# Patient Record
Sex: Male | Born: 1995
Health system: Southern US, Community
[De-identification: ages and names within clinical notes are randomized; demographics above are authoritative.]

## PROBLEM LIST (undated history)

## (undated) DIAGNOSIS — Z21 Asymptomatic human immunodeficiency virus [HIV] infection status: Secondary | ICD-10-CM

## (undated) DIAGNOSIS — Z87442 Personal history of urinary calculi: Secondary | ICD-10-CM

## (undated) DIAGNOSIS — N200 Calculus of kidney: Secondary | ICD-10-CM

## (undated) DIAGNOSIS — T7840XA Allergy, unspecified, initial encounter: Secondary | ICD-10-CM

## (undated) DIAGNOSIS — K3580 Unspecified acute appendicitis: Principal | ICD-10-CM

## (undated) DIAGNOSIS — A539 Syphilis, unspecified: Secondary | ICD-10-CM

## (undated) DIAGNOSIS — L0292 Furuncle, unspecified: Secondary | ICD-10-CM

## (undated) DIAGNOSIS — B20 Human immunodeficiency virus [HIV] disease: Secondary | ICD-10-CM

## (undated) HISTORY — DX: Allergy, unspecified, initial encounter: T78.40XA

## (undated) HISTORY — DX: Calculus of kidney: N20.0

---
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
## 2013-01-10 ENCOUNTER — Encounter (INDEPENDENT_AMBULATORY_CARE_PROVIDER_SITE_OTHER): Payer: Self-pay

## 2013-09-01 ENCOUNTER — Emergency Department (HOSPITAL_COMMUNITY)
Admission: EM | Admit: 2013-09-01 | Discharge: 2013-09-01 | Disposition: A | Discharge status: Home / Self Care | Payer: Medicaid Other | Attending: Emergency Medicine | Admitting: Emergency Medicine

## 2013-09-01 ENCOUNTER — Encounter (HOSPITAL_COMMUNITY): Payer: Self-pay | Admitting: Emergency Medicine

## 2013-09-01 DIAGNOSIS — Z87442 Personal history of urinary calculi: Secondary | ICD-10-CM | POA: Insufficient documentation

## 2013-09-01 DIAGNOSIS — L02419 Cutaneous abscess of limb, unspecified: Secondary | ICD-10-CM | POA: Insufficient documentation

## 2013-09-01 DIAGNOSIS — L0291 Cutaneous abscess, unspecified: Secondary | ICD-10-CM

## 2013-09-01 DIAGNOSIS — Z8719 Personal history of other diseases of the digestive system: Secondary | ICD-10-CM | POA: Insufficient documentation

## 2013-09-01 DIAGNOSIS — F17200 Nicotine dependence, unspecified, uncomplicated: Secondary | ICD-10-CM | POA: Insufficient documentation

## 2013-09-01 DIAGNOSIS — L03119 Cellulitis of unspecified part of limb: Principal | ICD-10-CM

## 2013-09-01 MED ORDER — HYDROCODONE-ACETAMINOPHEN 5-325 MG PO TABS
1.0000 | ORAL_TABLET | Freq: Once | ORAL | Status: AC
  Administered 2013-09-01: 1 via ORAL
  Filled 2013-09-01: qty 1

## 2013-09-01 MED ORDER — SULFAMETHOXAZOLE-TRIMETHOPRIM 800-160 MG PO TABS: 2.0000 | ORAL_TABLET | Freq: Two times a day (BID) | ORAL | Status: DC

## 2013-09-01 MED ORDER — HYDROCODONE-ACETAMINOPHEN 5-325 MG PO TABS: ORAL_TABLET | ORAL | Status: DC

## 2013-09-01 NOTE — Discharge Instructions (Signed)
Please read and follow all provided instructions.  Your diagnoses today include:  1. Abscess     Tests performed today include:  Vital signs. See below for your results today.   Medications prescribed:   Bactrim (trimethoprim/sulfamethoxazole) - antibiotic  You have been prescribed an antibiotic medicine: take the entire course of medicine even if you are feeling better. Stopping early can cause the antibiotic not to work.   Vicodin (hydrocodone/acetaminophen) - narcotic pain medication  DO NOT drive or perform any activities that require you to be awake and alert because this medicine can make you drowsy. BE VERY CAREFUL not to take multiple medicines containing Tylenol (also called acetaminophen). Doing so can lead to an overdose which can damage your liver and cause liver failure and possibly death.  Take any prescribed medications only as directed.   Home care instructions:   Follow any educational materials contained in this packet  Follow-up instructions: *Return to the Emergency Department in 48 hours for a recheck if your symptoms are not significantly improved.  Please follow-up with your primary care provider in the next 1 week for further evaluation of your symptoms. If you do not have a primary care doctor -- see below for referral information.   Return instructions:  Return to the Emergency Department if you have:  Fever  Worsening symptoms  Worsening pain  Worsening swelling  Redness of the skin that moves away from the affected area, especially if it streaks away from the affected area   Any other emergent concerns  Your vital signs today were: BP 124/81   Pulse 97   Temp(Src) 99.2 F (37.3 C) (Oral)   Resp 16   SpO2 100% If your blood pressure (BP) was elevated above 135/85 this visit, please have this repeated by your doctor within one month. --------------

## 2013-09-01 NOTE — ED Notes (Signed)
Pt has abscess to R upper thigh. Pt states he "popped" the area yesterday and some pus came out. Pt has also been using warm compresses. Pt states area is still getting bigger and more painful. Pt ambulatory to exam room with steady gait.

## 2013-09-01 NOTE — ED Provider Notes (Signed)
CSN: [id number]     Arrival date & time 09/01/13  1802 History  This chart was scribed for non-physician practitioner, Rhea BleacherJosh Lio Wehrly, PA-C,working with Juliet RudeNathan R. Rubin PayorPickering, MD, by Karle PlumberJennifer Tensley, ED Scribe.  This patient was seen in room WTR8/WTR8 and the patient's care was started at 6:27 PM.     Chief Complaint  Patient presents with  . Abscess   The history is provided by the patient. No language interpreter was used.   HPI Comments:  Dustin [name] is a 18 y.o. transgender male who presents to the Emergency Department complaining of a new onset abscess to the right upper lateral thigh that started two days ago. Pt states she tried to "pop" the abscess and expressed some pus drainage yesterday. Pt reports applying warm compresses to the area with no significant relief. She states the area has progressively gotten worse and is very painful. She denies fever or chills.   Past Medical History  Diagnosis Date  . Kidney stones   . Allergy   . Acute appendicitis without mention of peritonitis 12/23/2012  . History of nephrolithiasis 12/23/2012    He had some hematuria on admit, asymptomatic   Past Surgical History  Procedure Laterality Date  . Laparoscopic appendectomy N/A 12/22/2012    Procedure: APPENDECTOMY LAPAROSCOPIC;  Surgeon: Adolph Pollackodd J Rosenbower, MD;  Location: [facility];  Service: General;  Laterality: N/A;   Family History  Problem Relation Age of Onset  . Heart disease Mother   . Drug abuse Father   . Diabetes Father   . Hypertension Father   . Hyperlipidemia Father   . Kidney disease Paternal Grandmother   . Stroke Paternal Grandmother   . Diabetes Paternal Grandfather   . Hyperlipidemia Paternal Grandfather   . Hypertension Paternal Grandfather    History  Substance Use Topics  . Smoking status: Current Every Day Smoker -- 1.00 packs/day    Types: Cigarettes  . Smokeless tobacco: Never Used  . Alcohol Use: 1.2 oz/week    2 Cans of beer per week    Review of  Systems  Constitutional: Negative for fever and chills.  Gastrointestinal: Negative for nausea and vomiting.  Skin: Negative for color change.       Abscess to right upper thigh  Hematological: Negative for adenopathy.    Allergies  Review of patient's allergies indicates no known allergies.  Home Medications   Prior to Admission medications   Medication Sig Start Date End Date Taking? Authorizing Provider  acetaminophen (TYLENOL) 325 MG tablet Take 2 tablets (650 mg total) by mouth every 6 (six) hours as needed. 12/23/12   Sherrie GeorgeWillard Jennings, PA-C  HYDROcodone-acetaminophen (NORCO) 5-325 MG per tablet Take 1 tablet by mouth every 6 (six) hours as needed for pain. 12/26/12   Adolph Pollackodd J Rosenbower, MD  ondansetron (ZOFRAN ODT) 4 MG disintegrating tablet Take 1 tablet (4 mg total) by mouth every 8 (eight) hours as needed for nausea. 12/24/12   Santiago GladHeather Laisure, PA-C  oxyCODONE-acetaminophen (PERCOCET/ROXICET) 5-325 MG per tablet This has tylenol in it.  Do not take more than 4000 mg of tylenol per day. 12/23/12   Sherrie GeorgeWillard Jennings, PA-C  oxyCODONE-acetaminophen (PERCOCET/ROXICET) 5-325 MG per tablet Take 1-2 tablets by mouth every 6 (six) hours as needed for pain. 12/24/12   Santiago GladHeather Laisure, PA-C   Triage Vitals: BP 124/81  Pulse 97  Temp(Src) 99.2 F (37.3 C) (Oral)  Resp 16  SpO2 100% Physical Exam  Nursing note and vitals reviewed. Constitutional: He appears well-developed and  well-nourished.  HENT:  Head: Normocephalic and atraumatic.  Eyes: Conjunctivae and EOM are normal. Right eye exhibits no discharge. Left eye exhibits no discharge.  Neck: Normal range of motion. Neck supple.  Cardiovascular: Normal rate, regular rhythm and normal heart sounds.   Pulmonary/Chest: Effort normal and breath sounds normal.  Abdominal: Soft. There is no tenderness.  Musculoskeletal: Normal range of motion.  Neurological: He is alert.  Skin: Skin is warm and dry. There is erythema.  6 cm area of induration of  right lateral thigh without cellulitis.  Psychiatric: He has a normal mood and affect. His behavior is normal.    ED Course  Procedures (including critical care time) DIAGNOSTIC STUDIES: Oxygen Saturation is 100% on RA, normal by my interpretation.   COORDINATION OF CARE: 6:31 PM- Will I & D abscess and prescribe antibiotics. Pt verbalizes understanding and agrees to plan.  INCISION AND DRAINAGE PROCEDURE NOTE: Patient identification was confirmed and verbal consent was obtained. This procedure was performed by Rhea BleacherJosh Nai Borromeo, PA-C at 6:38 PM. Site: right lateral thigh Sterile procedures observed Needle size: 25 G Anesthetic used (type and amt): Lidocaine 2% with Epinephrine (3 mL) Blade size: # 11 Drainage: large Complexity: Complex Site anesthetized, incision made over site, wound drained and explored loculations, covered with dry, sterile dressing.  Pt tolerated procedure well without complications.  Instructions for care discussed verbally and pt provided with additional written instructions for homecare and f/u.   Medications  HYDROcodone-acetaminophen (NORCO/VICODIN) 5-325 MG per tablet 1 tablet (1 tablet Oral Given 09/01/13 1854)    Labs Review Labs Reviewed - No data to display  Imaging Review No results found.   EKG Interpretation None      Patient seen and examined.  Pt agrees to proceed with I&D.   Vital signs reviewed and are as follows: Filed Vitals:   09/01/13 1820  BP: 124/81  Pulse: 97  Temp: 99.2 F (37.3 C)  Resp: 16   7:19 PM The patient was urged to return to the Emergency Department urgently with worsening pain, swelling, expanding erythema especially if it streaks away from the affected area, fever, or if they have any other concerns.   The patient was urged to return to the Emergency Department or go to their PCP in 48 hours for wound recheck if the area is not significantly improved.  Patient counseled on use of narcotic pain medications.  Counseled not to combine these medications with others containing tylenol. Urged not to drink alcohol, drive, or perform any other activities that requires focus while taking these medications. The patient verbalizes understanding and agrees with the plan.  The patient verbalized understanding and stated agreement with this plan.   MDM   Final diagnoses:  Abscess   Patient with skin abscess amenable to incision and drainage. Given size, I feel antibiotics are appropriate. No signs of cellulitis is surrounding skin. Will d/c to home. No systemic symptoms of illness.   I personally performed the services described in this documentation, which was scribed in my presence. The recorded information has been reviewed and is accurate.    Renne CriglerJoshua March Joos, PA-C 09/01/13 1920

## 2013-09-01 NOTE — ED Provider Notes (Signed)
Medical screening examination/treatment/procedure(s) were performed by non-physician practitioner and as supervising physician I was immediately available for consultation/collaboration.   EKG Interpretation None       Jametta Moorehead R. Tyrique Sporn, MD 09/01/13 2327 

## 2014-09-20 IMAGING — CT CT ABD-PELV W/ CM
1 of 2 series · 15 of 32 positions shown, 19 images · IV contrast (OMNIPAQUE 300)
Comparison: 10/04/2011.

CLINICAL DATA: Right lower quadrant tenderness.

CT ABDOMEN AND PELVIS WITH CONTRAST
TECHNIQUE: Multidetector CT imaging of the abdomen and pelvis was
performed following the standard protocol during bolus
administration of intravenous contrast.
Contrast: 100mL OMNIPAQUE IOHEXOL 300 MG/ML  SOLN

[Series 2: abd/pel with · axial · 0.74mm/px · z∈[-506,-71]mm · 15 of 95 slices shown, 19 images]
[im 4/95  soft-tissue]
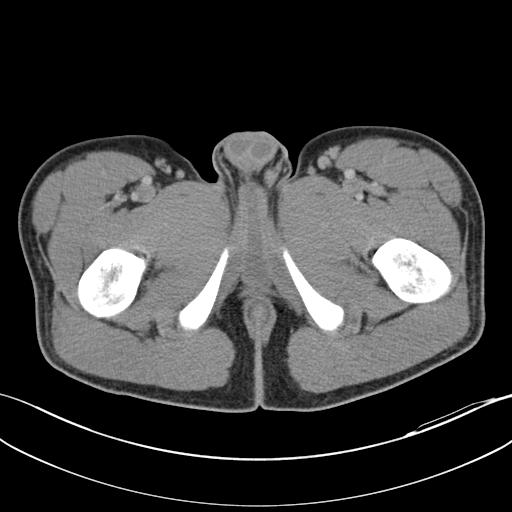
[im 4/95  bone]
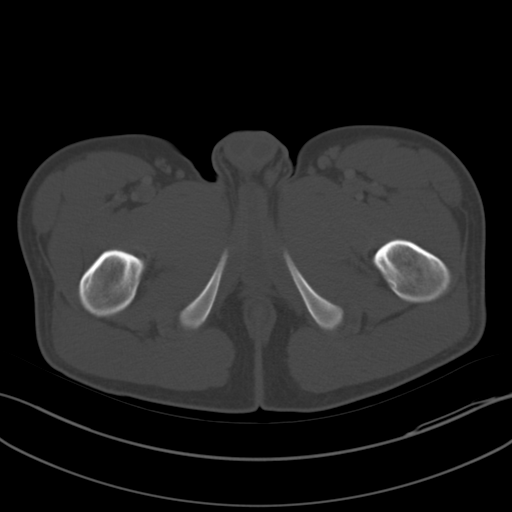
[im 12/95  soft-tissue]
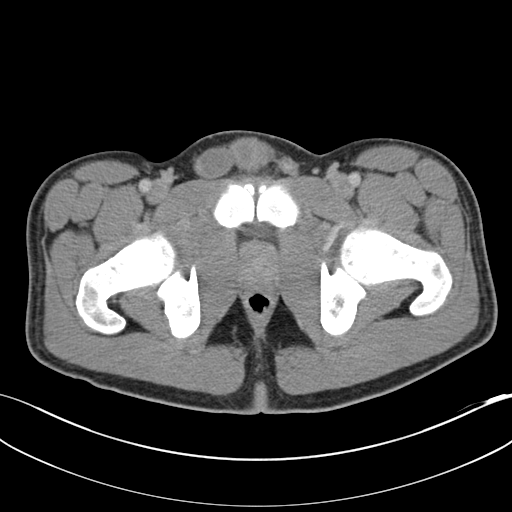
[im 19/95  soft-tissue]
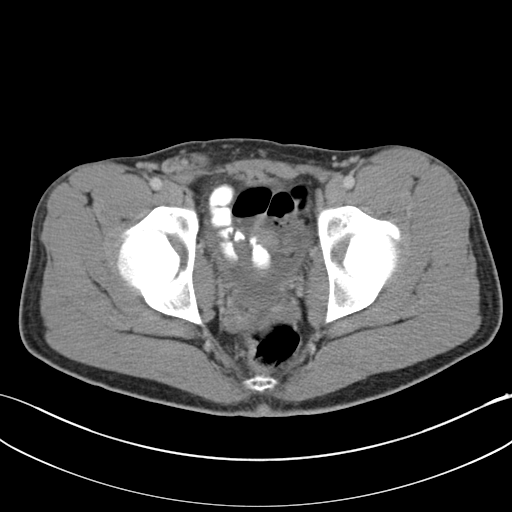
[im 27/95  soft-tissue]
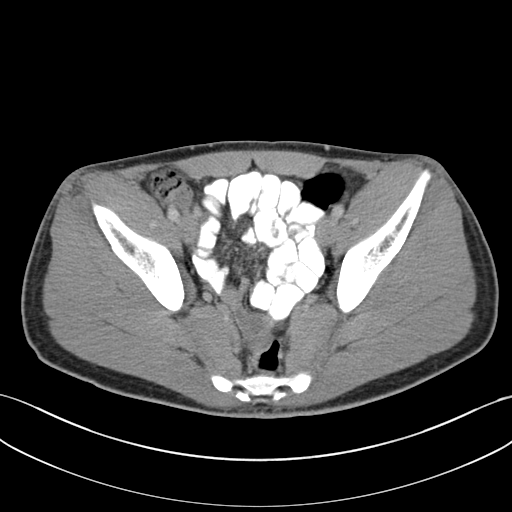
[im 34/95  soft-tissue]
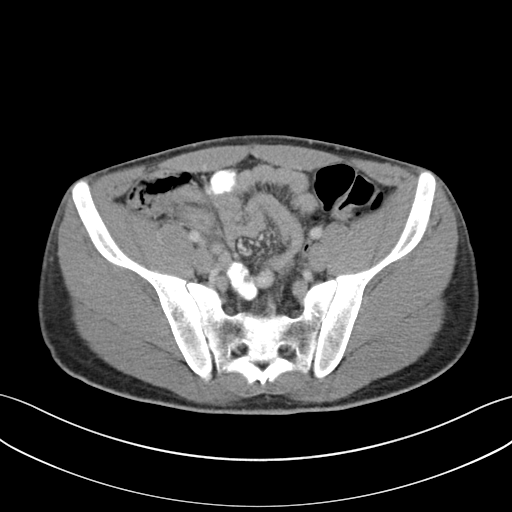
[im 42/95  soft-tissue]
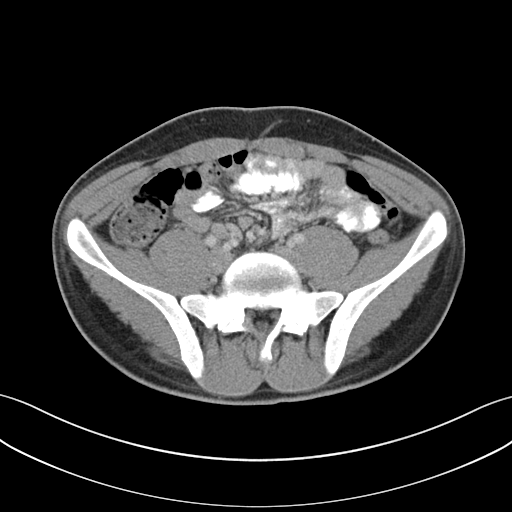
[im 49/95  soft-tissue]
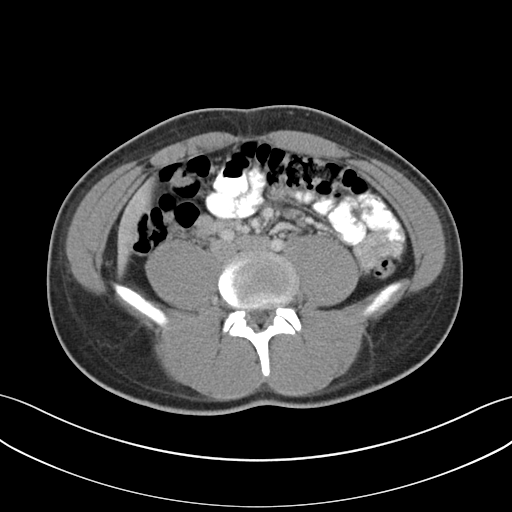
[im 53/95  soft-tissue]
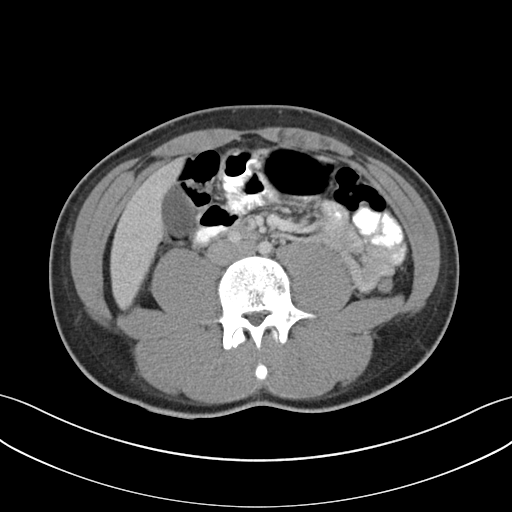
[im 61/95  soft-tissue]
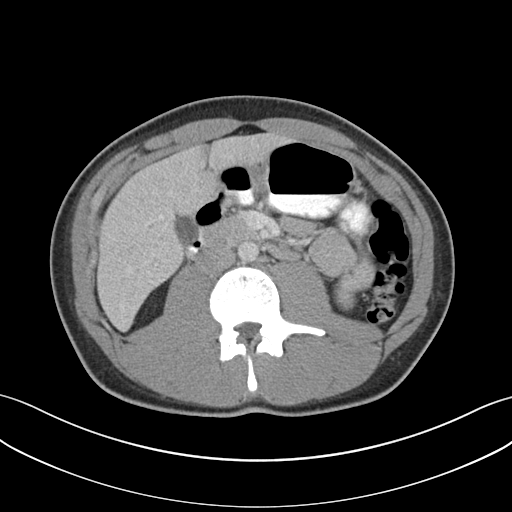
[im 61/95  bone]
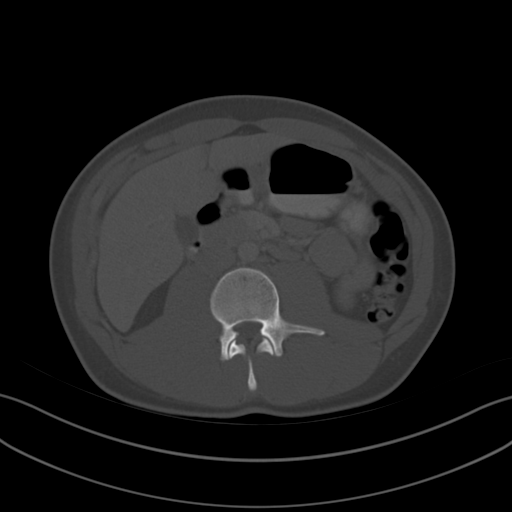
[im 68/95  soft-tissue]
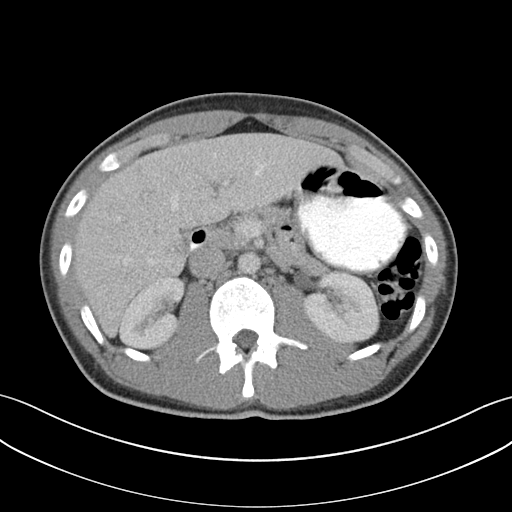
[im 76/95  soft-tissue]
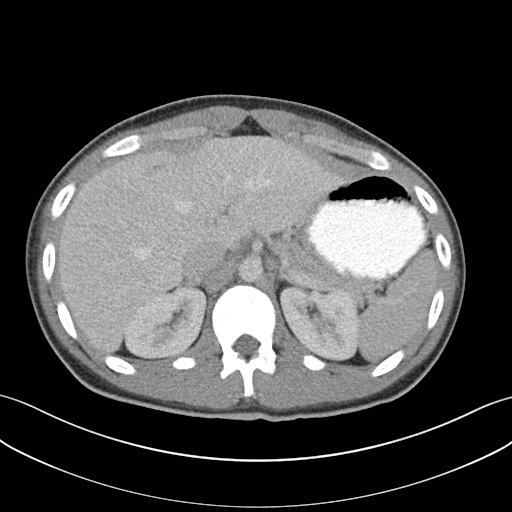
[im 79/95  lung]
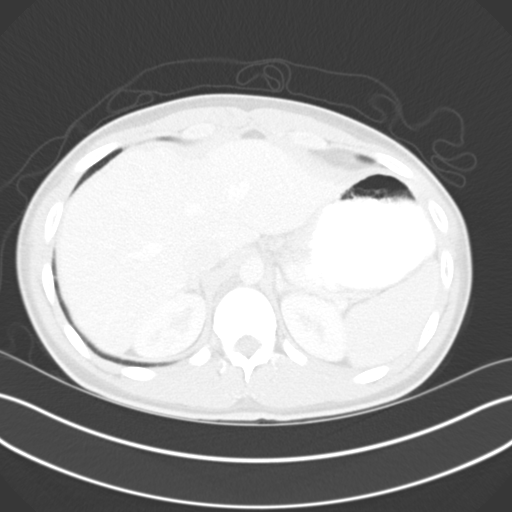
[im 83/95  soft-tissue]
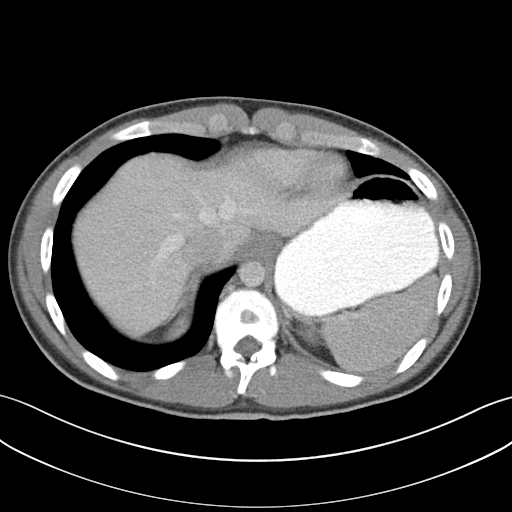
[im 83/95  lung]
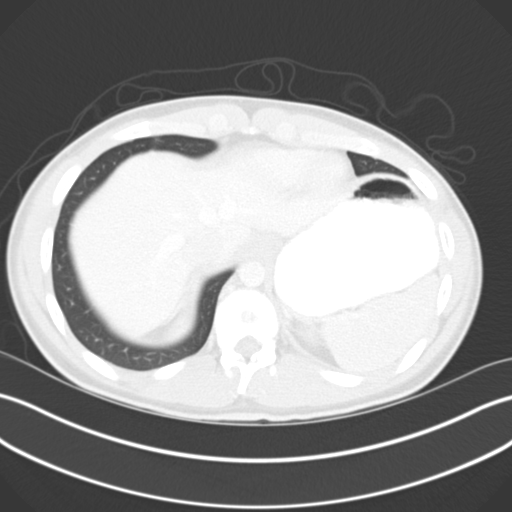
[im 87/95  lung]
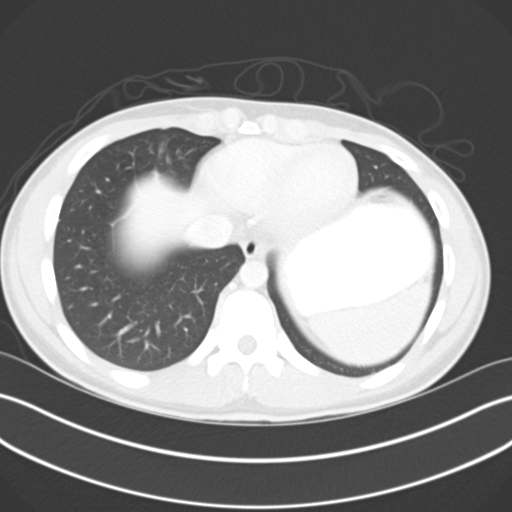
[im 91/95  soft-tissue]
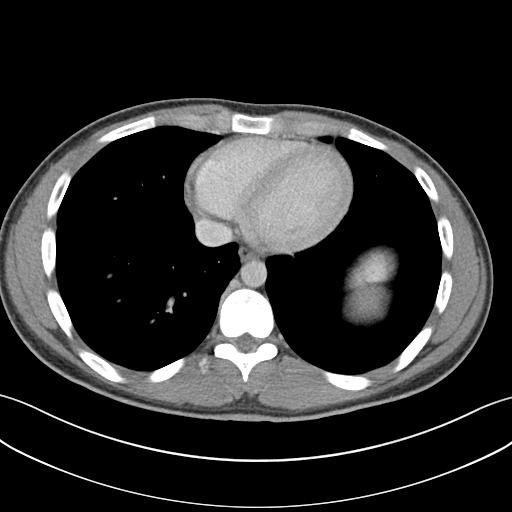
[im 91/95  lung]
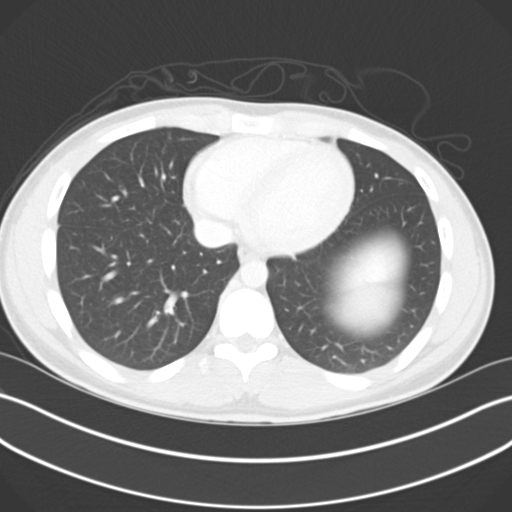

[15 of 32 positions shown; findings below may reference images not displayed]

FINDINGS: BODY WALL: Unremarkable.

LOWER CHEST:

Mediastinum: Unremarkable.

Lungs/pleura: No consolidation.

ABDOMEN/PELVIS:

Liver: No focal abnormality.

Biliary: No evidence of biliary obstruction or stone.

Pancreas: Unremarkable.

Spleen: Unremarkable.

Adrenals: Unremarkable.

Kidneys and ureters: No hydronephrosis or stone.

Bladder: Unremarkable.

Bowel:  The appendix dilated to 16 mm with thick enhancing wall.
Along the posterior margin, there is an outpouching of the wall,
likely developing perforation. Reactive ileocolic lymph node
enlargement.

Retroperitoneum: No mass or adenopathy.

Peritoneum: No free fluid or gas.

Reproductive: Unremarkable.

Vascular: No acute abnormality.

OSSEOUS: No acute abnormalities. No suspicious lytic or blastic
lesions.

Critical Value/emergent results were called by telephone at the
time of interpretation on 12/22/2012 at [DATE] a.m. to ED staff who
will relay message to Dr Fray when he becomes available.
IMPRESSION: Acute appendicitis with developing perforation.

## 2014-09-22 IMAGING — US US RENAL
1 series · 13 of 25 positions shown · non-contrast
Comparison: CT abdomen/pelvis 12/22/2012

CLINICAL DATA: Right flank pain

RENAL/URINARY TRACT ULTRASOUND COMPLETE

[Series 1: us renal · 0.25mm/px · 13 of 48 slices shown]
[im 1/48]
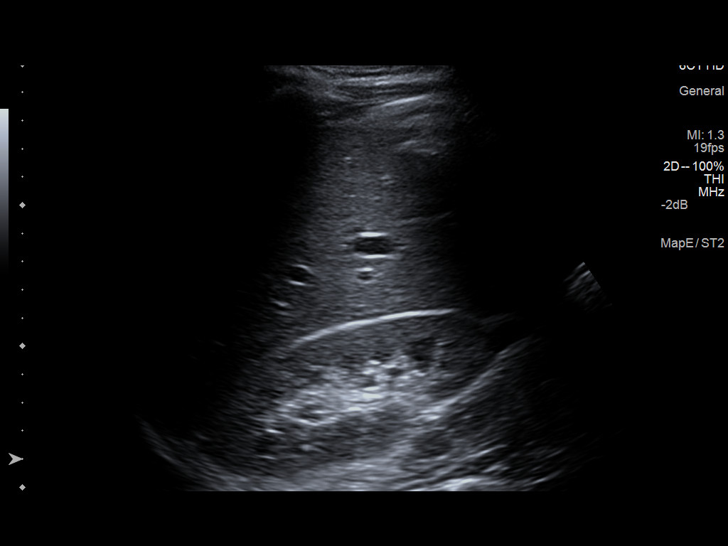
[im 4/48]
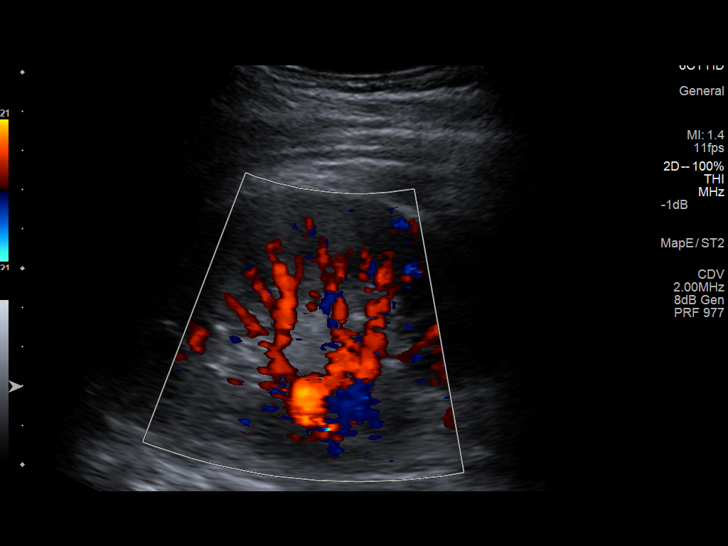
[im 8/48]
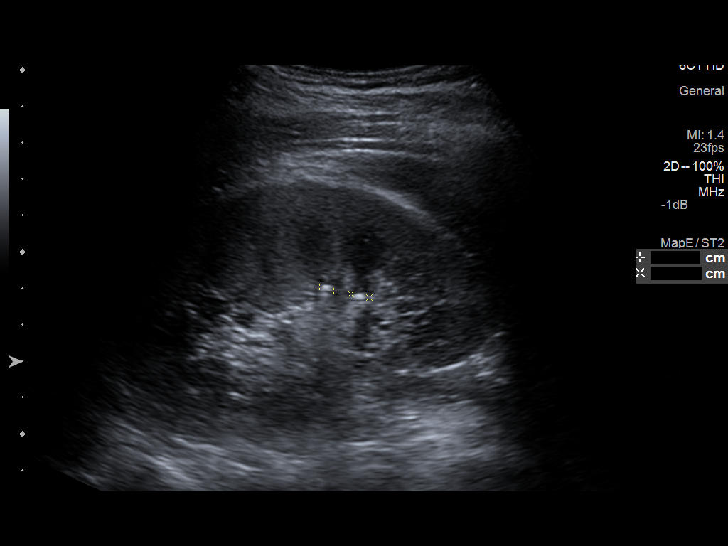
[im 12/48]
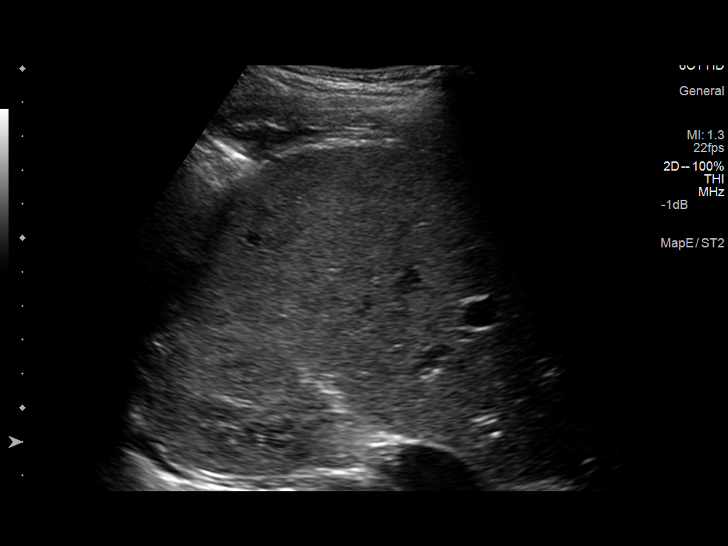
[im 16/48]
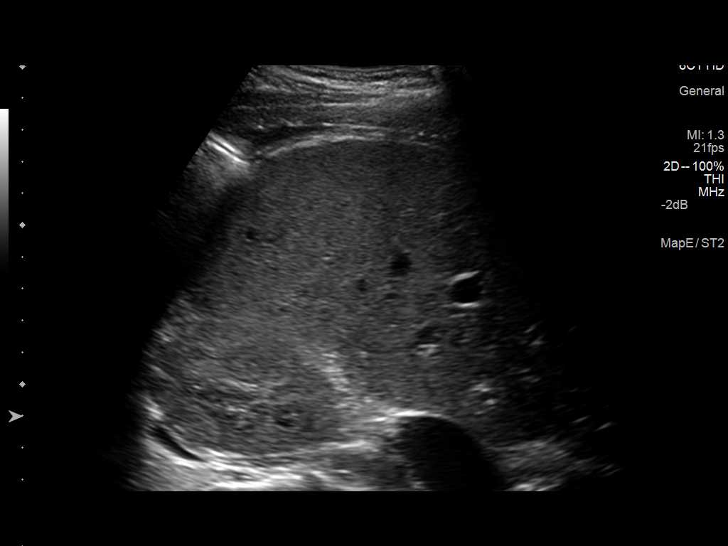
[im 20/48]
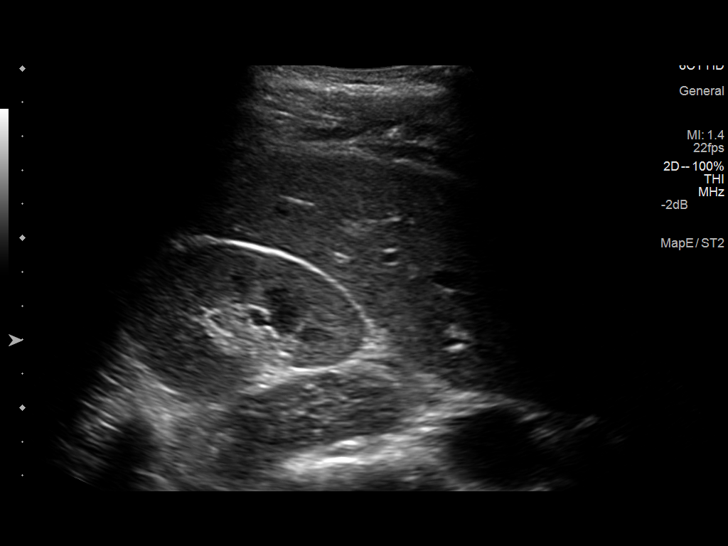
[im 24/48]
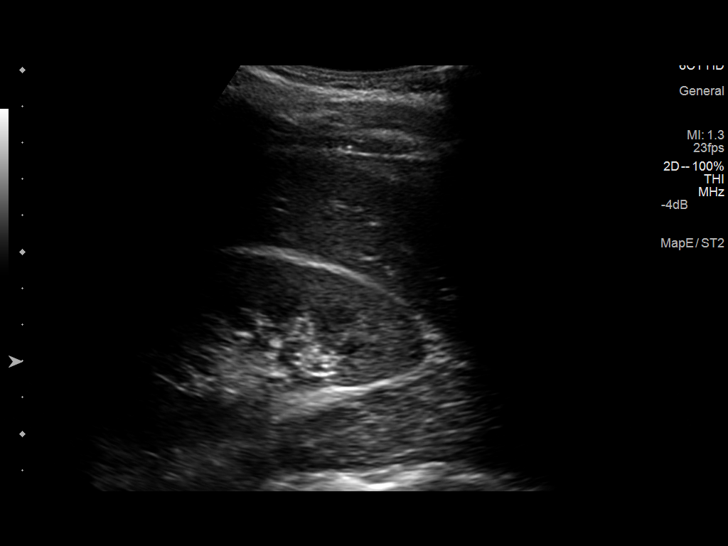
[im 28/48]
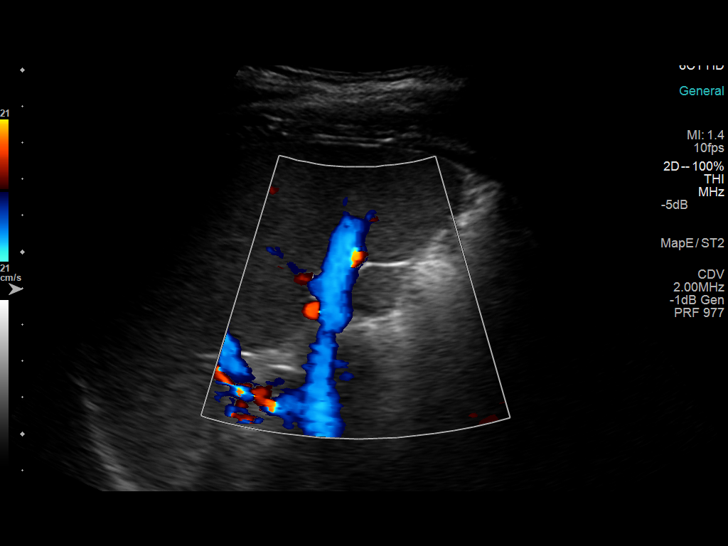
[im 32/48]
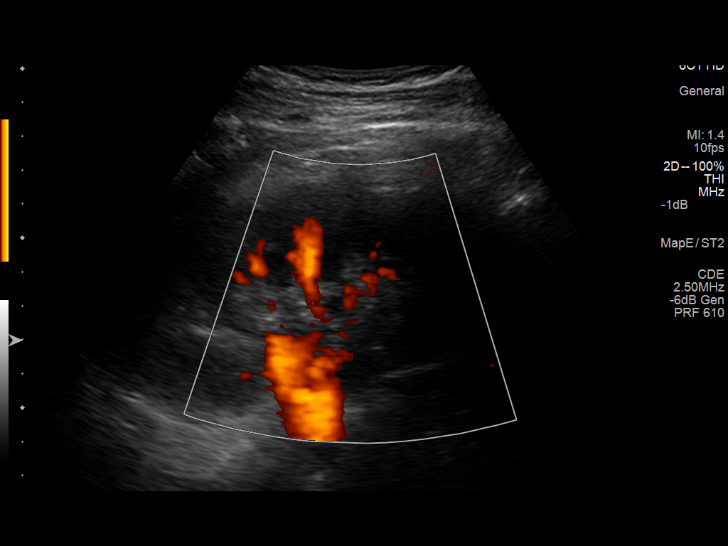
[im 36/48]
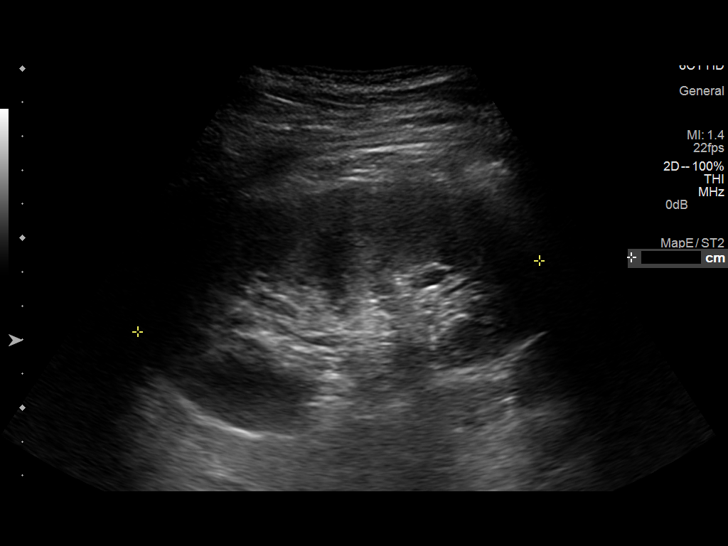
[im 40/48]
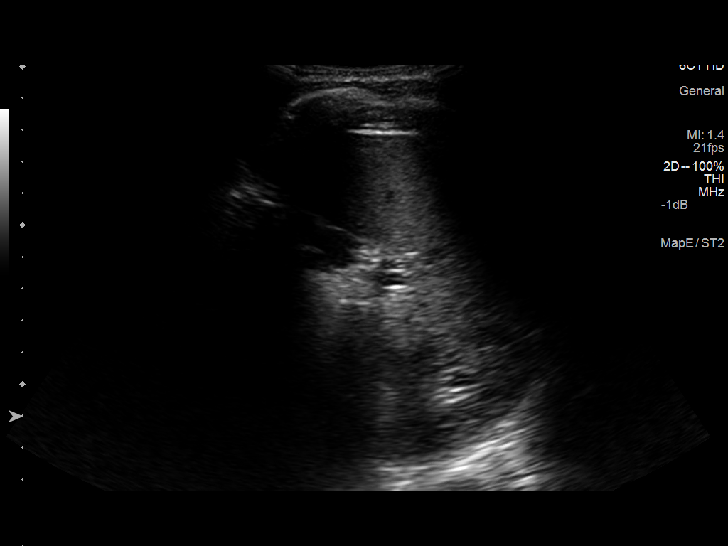
[im 44/48]
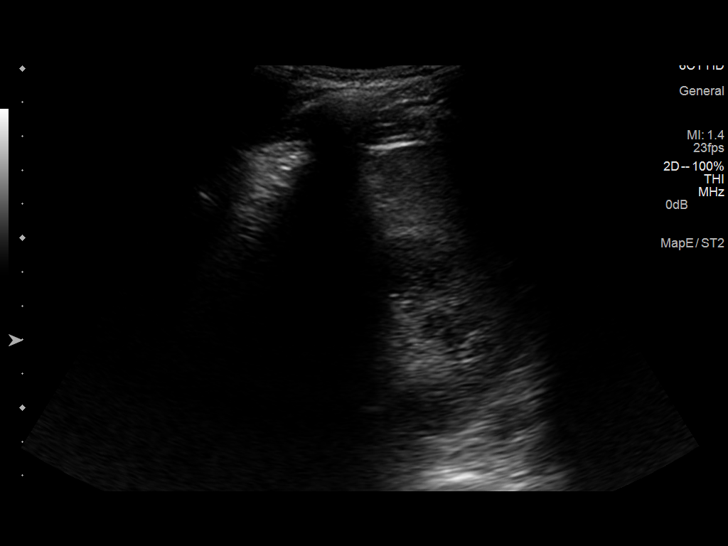
[im 48/48]
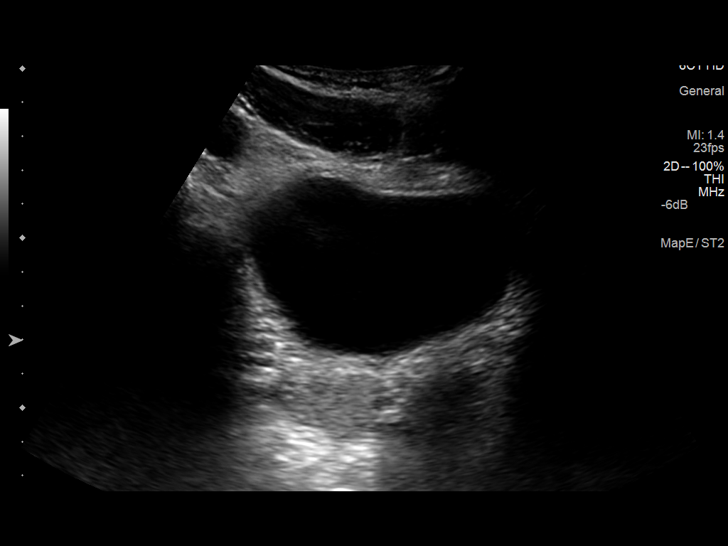

[13 of 25 positions shown; findings below may reference images not displayed]

FINDINGS: Right Kidney:  Normal in size (11.4 cm) and parenchymal
echogenicity.  No evidence of mass or hydronephrosis. Two 3 mm
echogenic foci identified at the junction of the interpolar and
lower pole regions to not definitively demonstrate shadowing and
cannot be identified on the recent CT scan.  These may represent
small renal calculi.

Left Kidney:  Normal in size (12 cm) and parenchymal echogenicity.
No evidence of mass or hydronephrosis.

Bladder:  Appears normal for degree of bladder distention.

Other:  Small accessory spleen incidentally noted.  There is a
small right pleural effusion.  Query small amount of perinephric
fluid near the lower pole of the right kidney.  This may be
artifactual.
IMPRESSION: 1.  Negative for hydronephrosis.
2.  Two small 3 mm echogenic foci in the right kidney at the
junction of the interpolar and lower pole regions may represent a
small nonobstructing renal calculi.  These were not visible on the
recent CT scan from 12/22/2012.
3.  Trace right pleural effusion and right perinephric fluid likely
related to recent appendectomy.

## 2015-11-13 ENCOUNTER — Emergency Department (HOSPITAL_COMMUNITY): Payer: Self-pay

## 2015-11-13 ENCOUNTER — Encounter (HOSPITAL_COMMUNITY): Payer: Self-pay

## 2015-11-13 ENCOUNTER — Emergency Department (HOSPITAL_COMMUNITY)
Admission: EM | Admit: 2015-11-13 | Discharge: 2015-11-13 | Disposition: A | Discharge status: Home / Self Care | Payer: Self-pay | Attending: Emergency Medicine | Admitting: Emergency Medicine

## 2015-11-13 DIAGNOSIS — B349 Viral infection, unspecified: Secondary | ICD-10-CM | POA: Insufficient documentation

## 2015-11-13 DIAGNOSIS — F17210 Nicotine dependence, cigarettes, uncomplicated: Secondary | ICD-10-CM | POA: Insufficient documentation

## 2015-11-13 DIAGNOSIS — J029 Acute pharyngitis, unspecified: Secondary | ICD-10-CM

## 2015-11-13 LAB — COMPREHENSIVE METABOLIC PANEL
ALT: 14 U/L — ABNORMAL LOW (ref 17–63)
ANION GAP: 8 (ref 5–15)
AST: 15 U/L (ref 15–41)
Albumin: 4.2 g/dL (ref 3.5–5.0)
Alkaline Phosphatase: 95 U/L (ref 38–126)
BUN: 13 mg/dL (ref 6–20)
CALCIUM: 8.5 mg/dL — AB (ref 8.9–10.3)
CHLORIDE: 104 mmol/L (ref 101–111)
CO2: 24 mmol/L (ref 22–32)
Creatinine, Ser: 1.45 mg/dL — ABNORMAL HIGH (ref 0.61–1.24)
GFR calc non Af Amer: >60 mL/min (ref >60)
Glucose, Bld: 91 mg/dL (ref 65–99)
POTASSIUM: 3.8 mmol/L (ref 3.5–5.1)
SODIUM: 136 mmol/L (ref 135–145)
Total Bilirubin: 0.3 mg/dL (ref 0.3–1.2)
Total Protein: 7.6 g/dL (ref 6.5–8.1)

## 2015-11-13 LAB — URINALYSIS, ROUTINE W REFLEX MICROSCOPIC
Bilirubin Urine: NEGATIVE
GLUCOSE, UA: NEGATIVE mg/dL
Hgb urine dipstick: NEGATIVE
Ketones, ur: NEGATIVE mg/dL
LEUKOCYTES UA: NEGATIVE
Nitrite: NEGATIVE
PROTEIN: NEGATIVE mg/dL
Specific Gravity, Urine: 1.014 (ref 1.005–1.030)
pH: 6.5 (ref 5.0–8.0)

## 2015-11-13 LAB — CBC WITH DIFFERENTIAL/PLATELET
BASOS PCT: 1 %
Basophils Absolute: 0 10*3/uL (ref 0.0–0.1)
EOS ABS: 0 10*3/uL (ref 0.0–0.7)
EOS PCT: 1 %
HCT: 43.5 % (ref 39.0–52.0)
Hemoglobin: 14.3 g/dL (ref 13.0–17.0)
LYMPHS PCT: 36 %
Lymphs Abs: 1.4 10*3/uL (ref 0.7–4.0)
MCH: 23.6 pg — ABNORMAL LOW (ref 26.0–34.0)
MCHC: 32.9 g/dL (ref 30.0–36.0)
MCV: 71.8 fL — AB (ref 78.0–100.0)
MONOS PCT: 12 %
Monocytes Absolute: 0.5 10*3/uL (ref 0.1–1.0)
NEUTROS PCT: 50 %
Neutro Abs: 2.1 10*3/uL (ref 1.7–7.7)
PLATELETS: 145 10*3/uL — AB (ref 150–400)
RBC: 6.06 MIL/uL — ABNORMAL HIGH (ref 4.22–5.81)
RDW: 14.4 % (ref 11.5–15.5)
WBC: 4 10*3/uL (ref 4.0–10.5)

## 2015-11-13 MED ORDER — SODIUM CHLORIDE 0.9 % IV BOLUS (SEPSIS)
1000.0000 mL | Freq: Once | INTRAVENOUS | Status: AC
  Administered 2015-11-13: 1000 mL via INTRAVENOUS

## 2015-11-13 MED ORDER — CLINDAMYCIN HCL 300 MG PO CAPS
450.0000 mg | ORAL_CAPSULE | Freq: Once | ORAL | Status: AC
  Administered 2015-11-13: 13:00:00 450 mg via ORAL
  Filled 2015-11-13: qty 1

## 2015-11-13 MED ORDER — CLINDAMYCIN HCL 150 MG PO CAPS: 450.0000 mg | ORAL_CAPSULE | Freq: Three times a day (TID) | ORAL | 0 refills | Status: DC

## 2015-11-13 NOTE — ED Provider Notes (Signed)
[facility]-EMERGENCY DEPT Provider Note   CSN: [id number] Arrival date & time: 11/13/15  1779  First Provider Contact:  First MD Initiated Contact with Patient 11/13/15 [phone]        History   Chief Complaint Chief Complaint  Patient presents with  . Weakness    HPI Dustin [name] is a 20 y.o. male who presents to the Emergency Department complaining of weakness.  She presents for multiple complaints. She reports feeling poorly for the last week with headache, sore throat, greatest on the left side, cough, feeling generally weak and feeling as if she might pass out with activity. She also has some swelling in her groin. She denies any fevers but feels hot at times. No sick contacts. Symptoms are moderate, constant, worsening.  The history is provided by the patient. No language interpreter was used.  Weakness     Past Medical History:  Diagnosis Date  . Acute appendicitis without mention of peritonitis 12/23/2012  . Allergy   . History of nephrolithiasis 12/23/2012   He had some hematuria on admit, asymptomatic  . Kidney stones     Patient Active Problem List   Diagnosis Date Noted  . Acute appendicitis without mention of peritonitis 12/23/2012  . History of nephrolithiasis 12/23/2012  . Elevated BP 07/22/2011  . Exposure to communicable disease 07/21/2011  . ATOPIC DERMATITIS 08/15/2007  . RHINITIS, ALLERGIC 06/17/2006    Past Surgical History:  Procedure Laterality Date  . LAPAROSCOPIC APPENDECTOMY N/A 12/22/2012   Procedure: APPENDECTOMY LAPAROSCOPIC;  Surgeon: Adolph Pollack, MD;  Location: [facility];  Service: General;  Laterality: N/A;    OB History    No data available       Home Medications    Prior to Admission medications   Medication Sig Start Date End Date Taking? Authorizing Provider  clindamycin (CLEOCIN) 150 MG capsule Take 3 capsules (450 mg total) by mouth 3 (three) times daily. 11/13/15   Tilden Fossa, MD    Family History Family History    Problem Relation Age of Onset  . Heart disease Mother   . Drug abuse Father   . Diabetes Father   . Hypertension Father   . Hyperlipidemia Father   . Kidney disease Paternal Grandmother   . Stroke Paternal Grandmother   . Diabetes Paternal Grandfather   . Hyperlipidemia Paternal Grandfather   . Hypertension Paternal Grandfather     Social History Social History  Substance Use Topics  . Smoking status: Current Every Day Smoker    Packs/day: 1.00    Types: Cigarettes  . Smokeless tobacco: Never Used  . Alcohol use 1.2 oz/week    2 Cans of beer per week     Allergies   Review of patient's allergies indicates no known allergies.   Review of Systems Review of Systems  Genitourinary: Negative for discharge and dysuria.  Skin: Positive for rash.       Mild itching  Neurological: Positive for weakness.  All other systems reviewed and are negative.    Physical Exam Updated Vital Signs BP 142/91 (BP Location: Left Arm)   Pulse 92   Temp 100.1 F (37.8 C) (Oral)   Resp 18   SpO2 98%   Physical Exam  Constitutional: He is oriented to person, place, and time. He appears well-developed and well-nourished.  HENT:  Head: Normocephalic and atraumatic.  Mild erythema of the posterior oropharynx, left tonsillar pillar is slightly more edematous than the right  Cardiovascular: Regular rhythm.   No  murmur heard. Tachycardia  Pulmonary/Chest: Effort normal and breath sounds normal. No respiratory distress.  Abdominal: Soft. There is no tenderness. There is no rebound and no guarding.  Genitourinary: Penis normal.  Genitourinary Comments: Right inguinal lymphadenopathy  Musculoskeletal: He exhibits no edema or tenderness.  Neurological: He is alert and oriented to person, place, and time.  Skin: Skin is warm and dry. Rash noted.  Diffuse macular rash of the face, trunk, extremities.  Psychiatric: He has a normal mood and affect. His behavior is normal.  Nursing note and  vitals reviewed.    ED Treatments / Results  Labs (all labs ordered are listed, but only abnormal results are displayed) Labs Reviewed  COMPREHENSIVE METABOLIC PANEL - Abnormal; Notable for the following:       Result Value   Creatinine, Ser 1.45 (*)    Calcium 8.5 (*)    ALT 14 (*)    All other components within normal limits  CBC WITH DIFFERENTIAL/PLATELET - Abnormal; Notable for the following:    RBC 6.06 (*)    MCV 71.8 (*)    MCH 23.6 (*)    Platelets 145 (*)    All other components within normal limits  RPR  URINALYSIS, ROUTINE W REFLEX MICROSCOPIC (NOT AT [facility])  HIV ANTIBODY (ROUTINE TESTING)    EKG  EKG Interpretation None       Radiology Dg Chest 2 View  Result Date: 11/13/2015 CLINICAL DATA:  Worsening cough and sore throat for about 1 week. Fever this morning. EXAM: CHEST  2 VIEW COMPARISON:  04/22/2004 FINDINGS: The heart size and mediastinal contours are within normal limits. Both lungs are clear. The visualized skeletal structures are unremarkable. IMPRESSION: No active cardiopulmonary disease. Electronically Signed   By: Kennith Center M.D.   On: 11/13/2015 09:07   Procedures Procedures (including critical care time)  Medications Ordered in ED Medications  sodium chloride 0.9 % bolus 1,000 mL (0 mLs Intravenous Stopped 11/13/15 1100)  clindamycin (CLEOCIN) capsule 450 mg (450 mg Oral Given 11/13/15 1300)     Initial Impression / Assessment and Plan / ED Course  I have reviewed the triage vital signs and the nursing notes.  Pertinent labs & imaging results that were available during my care of the patient were reviewed by me and considered in my medical decision making (see chart for details).  Clinical Course    Pt here for body aches, malaise.  She is nontoxic appearing on exam.  Rash is c/w viral exanthem.  There is mild tonsillar asymmetry on exam with no definite PTA but will treat for potential developing infection.  D/w pt home care, close  outpatient follow up, return precautions.    Final Clinical Impressions(s) / ED Diagnoses   Final diagnoses:  Viral illness  Pharyngitis    New Prescriptions Discharge Medication List as of 11/13/2015 12:48 PM    START taking these medications   Details  clindamycin (CLEOCIN) 150 MG capsule Take 3 capsules (450 mg total) by mouth 3 (three) times daily., Starting Wed 11/13/2015, Print         Tilden Fossa, MD 11/14/15 [phone]

## 2015-11-13 NOTE — ED Triage Notes (Signed)
He c/o feeling as if he were going to faint (but didn't). He further c/o sore "bumps" at right groin area.  He is in no distress.  Pt. Also c/o mild, non-productive cough and sore throat.

## 2015-11-13 NOTE — ED Notes (Signed)
Pt was notified about Urinalysis 

## 2015-11-14 LAB — RPR: RPR: NONREACTIVE

## 2015-11-20 ENCOUNTER — Telehealth: Payer: Self-pay | Admitting: Infectious Disease

## 2015-11-20 LAB — HIV ANTIBODY (ROUTINE TESTING W REFLEX)

## 2015-11-20 LAB — RNA QUALITATIVE: HIV 1 RNA Qualitative: UNDETERMINED

## 2015-11-20 LAB — HIV 1/2 AB DIFFERENTIATION
HIV 1 Ab: NEGATIVE
HIV 2 Ab: NEGATIVE

## 2015-11-20 NOTE — Telephone Encounter (Signed)
Patient sounds like they VERY much could have acute HIV  Dustin [name] can we bring her in to research for repeat test and an HIV ULtraquant so we can get it turned around  They didn't have enough to do qual rna and pts ssx sound like acute HIV

## 2015-11-24 ENCOUNTER — Emergency Department (HOSPITAL_COMMUNITY)
Admission: EM | Admit: 2015-11-24 | Discharge: 2015-11-24 | Disposition: A | Discharge status: Home / Self Care | Payer: Medicaid Other | Attending: Emergency Medicine | Admitting: Emergency Medicine

## 2015-11-24 DIAGNOSIS — L989 Disorder of the skin and subcutaneous tissue, unspecified: Secondary | ICD-10-CM | POA: Insufficient documentation

## 2015-11-24 DIAGNOSIS — Z79899 Other long term (current) drug therapy: Secondary | ICD-10-CM | POA: Insufficient documentation

## 2015-11-24 DIAGNOSIS — F17210 Nicotine dependence, cigarettes, uncomplicated: Secondary | ICD-10-CM | POA: Insufficient documentation

## 2015-11-24 MED ORDER — DOXYCYCLINE HYCLATE 100 MG PO CAPS: 100.0000 mg | ORAL_CAPSULE | Freq: Two times a day (BID) | ORAL | 0 refills | Status: DC

## 2015-11-24 MED ORDER — PENICILLIN G BENZATHINE 1200000 UNIT/2ML IM SUSP
2.4000 10*6.[IU] | Freq: Once | INTRAMUSCULAR | Status: AC
  Administered 2015-11-24: 2.4 10*6.[IU] via INTRAMUSCULAR
  Filled 2015-11-24: qty 4

## 2015-11-24 MED ORDER — LIDOCAINE HCL (PF) 1 % IJ SOLN
2.0000 mL | Freq: Once | INTRAMUSCULAR | Status: DC
Start: 2015-11-24 — End: 2015-11-24
  Filled 2015-11-24: qty 30

## 2015-11-24 NOTE — Discharge Instructions (Signed)
Read the information below.  Use the prescribed medication as directed.  Please discuss all new medications with your pharmacist.  You may return to the Emergency Department at any time for worsening condition or any new symptoms that concern you.  If you develop redness, swelling, pus draining from the wound, increased pain, difficulty urinating, or fevers greater than 100.4, return to the ER immediately for a recheck.

## 2015-11-24 NOTE — ED Provider Notes (Signed)
[facility]-EMERGENCY DEPT Provider Note   CSN: [id number] Arrival date & time: 11/24/15  4098  First Provider Contact:  First MD Initiated Contact with Patient 11/24/15 2046     .By signing my name below, I, Freida Busman, attest that this documentation has been prepared under the direction and in the presence of non-physician practitioner, Trixie Dredge, PA-C. Electronically Signed: Freida Busman, Scribe. 11/24/2015. 8:57 PM.   History   Chief Complaint Chief Complaint  Patient presents with  . Skin Problem   The history is provided by the patient. No language interpreter was used.     HPI Comments:  Dustin [name] is a 20 y.o. male who presents to the Emergency Department complaining of "infected hair bump" x 1 week. Pt states she plucked 2 hairs from the bump. Pt reports her pain a 5/10. She states when the site gets hard she  takes a bath and the area gets soft and she picks at it however denies drainage. Pt denies h/o same.She has been spraying the site with alcohol and applying antibiotic cream.  She also denies abdominal pain and fever.  Was recently ill with fever and sore throat, rash - states this has completely resolved.  Denies recent fevers, cough.      Past Medical History:  Diagnosis Date  . Acute appendicitis without mention of peritonitis 12/23/2012  . Allergy   . History of nephrolithiasis 12/23/2012   He had some hematuria on admit, asymptomatic  . Kidney stones     Patient Active Problem List   Diagnosis Date Noted  . Acute appendicitis without mention of peritonitis 12/23/2012  . History of nephrolithiasis 12/23/2012  . Elevated BP 07/22/2011  . Exposure to communicable disease 07/21/2011  . ATOPIC DERMATITIS 08/15/2007  . RHINITIS, ALLERGIC 06/17/2006    Past Surgical History:  Procedure Laterality Date  . LAPAROSCOPIC APPENDECTOMY N/A 12/22/2012   Procedure: APPENDECTOMY LAPAROSCOPIC;  Surgeon: Adolph Pollack, MD;  Location: [facility];  Service: General;   Laterality: N/A;    OB History    No data available       Home Medications    Prior to Admission medications   Medication Sig Start Date End Date Taking? Authorizing Provider  neomycin-polymyxin-pramoxine (NEOSPORIN PLUS) 1 % cream Apply 1 application topically once.   Yes Historical Provider, MD  clindamycin (CLEOCIN) 150 MG capsule Take 3 capsules (450 mg total) by mouth 3 (three) times daily. Patient not taking: Reported on 11/24/2015 11/13/15   Tilden Fossa, MD  doxycycline (VIBRAMYCIN) 100 MG capsule Take 1 capsule (100 mg total) by mouth 2 (two) times daily. One po bid x 7 days 11/24/15   Trixie Dredge, PA-C    Family History Family History  Problem Relation Age of Onset  . Heart disease Mother   . Drug abuse Father   . Diabetes Father   . Hypertension Father   . Hyperlipidemia Father   . Kidney disease Paternal Grandmother   . Stroke Paternal Grandmother   . Diabetes Paternal Grandfather   . Hyperlipidemia Paternal Grandfather   . Hypertension Paternal Grandfather     Social History Social History  Substance Use Topics  . Smoking status: Current Every Day Smoker    Packs/day: 1.00    Types: Cigarettes  . Smokeless tobacco: Never Used  . Alcohol use 1.2 oz/week    2 Cans of beer per week     Allergies   Review of patient's allergies indicates no known allergies.   Review of Systems Review  of Systems  Constitutional: Negative for fever.  Gastrointestinal: Negative for abdominal pain.  Skin: Positive for wound.  All other systems reviewed and are negative.    Physical Exam Updated Vital Signs BP (!) 165/111 (BP Location: Left Arm)   Pulse 91   Temp 98.8 F (37.1 C)   Resp 18   Ht 6\' 5"  (1.956 m)   Wt 92.5 kg   SpO2 100%   BMI 24.19 kg/m   Physical Exam  Constitutional: He appears well-developed and well-nourished. No distress.  HENT:  Head: Normocephalic and atraumatic.  Neck: Neck supple.  Cardiovascular: Normal rate.   Pulmonary/Chest:  Effort normal and breath sounds normal. No respiratory distress. He has no wheezes. He has no rales.  Neurological: He is alert.  Skin: He is not diaphoretic.  Shallow base non tender ulceration in suprapubic region   Nursing note and vitals reviewed.    ED Treatments / Results  DIAGNOSTIC STUDIES:  Oxygen Saturation is 100% on RA, normal by my interpretation.    COORDINATION OF CARE:  8:51 PM Discussed treatment plan with pt at bedside and pt agreed to plan.  Labs (all labs ordered are listed, but only abnormal results are displayed) Labs Reviewed  RPR  HIV ANTIBODY (ROUTINE TESTING)  HIV-1 RNA ULTRAQUANT REFLEX TO [facility]ENT[facility]+    EKG  EKG Interpretation None       Radiology No results found.  Procedures Procedures   Medications Ordered in ED Medications  penicillin g benzathine (BICILLIN LA) 1200000 UNIT/2ML injection 2.4 Million Units (2.4 Million Units Intramuscular Given 11/24/15 2141)     Initial Impression / Assessment and Plan / ED Course  I have reviewed the triage vital signs and the nursing notes.  Pertinent labs & imaging results that were available during my care of the patient were reviewed by me and considered in my medical decision making (see chart for details).  Clinical Course    Afebrile, nontoxic patient with recent febrile illness that has completely resolved ,presenting with skin lesion in groin area that appears clinically concerning for chancre.  Pt made aware of this.  Had recent negative RPR.  Had HIV test that did not have sufficient blood for results, had been referred to ID who was concerned and recommending further treatment.  Discussed pt and workup with Dr Dalene SeltzerSchlossman.  Pt tested for HIV, RPR again.  Treated empirically for primary syphilis.   D/C home with doxycycline, ID, PCP follow up.   Discussed result, findings, treatment, and follow up  with patient.  Pt given return precautions.  Pt verbalizes understanding and agrees with plan.        Final Clinical Impressions(s) / ED Diagnoses   Final diagnoses:  Skin lesion    New Prescriptions Discharge Medication List as of 11/24/2015  9:55 PM    START taking these medications   Details  doxycycline (VIBRAMYCIN) 100 MG capsule Take 1 capsule (100 mg total) by mouth 2 (two) times daily. One po bid x 7 days, Starting Sun 11/24/2015, Print        I personally performed the services described in this documentation, which was scribed in my presence. The recorded information has been reviewed and is accurate.     Trixie Dredgemily Bates [place], PA-C 11/24/15 2211    Alvira MondayErin Schlossman, MD 11/26/15 [phone]

## 2015-11-24 NOTE — ED Triage Notes (Signed)
Pt states that he noticed a week ago an infected hair follicle in his public region that has since gotten worse. Alert and oriented.

## 2015-11-26 ENCOUNTER — Telehealth: Payer: Self-pay | Admitting: Infectious Disease

## 2015-11-26 LAB — HIV 1/2 AB DIFFERENTIATION
HIV 1 Ab: POSITIVE — AB
HIV 2 AB: NEGATIVE

## 2015-11-26 LAB — HIV ANTIBODY (ROUTINE TESTING W REFLEX)

## 2015-11-26 LAB — RPR: RPR Ser Ql: REACTIVE — AB

## 2015-11-26 LAB — RPR, QUANT+TP ABS (REFLEX): TREPONEMA PALLIDUM AB: POSITIVE — AB

## 2015-11-26 NOTE — Telephone Encounter (Signed)
This is the ACUTE case that we were trying to bring in for possible ACTG study, she has now seroconverted (and not eligible for the study but HIGHLY contagious and needs to get priority to come in and get on meds.  Kim gave contact to DIS officer  Are there other resources we can deploy , maybe check in with DIS officer  I  Wish ED would have called to let us know the patient was here again (they probably didn't know we were looking for her)

## 2015-11-27 ENCOUNTER — Telehealth (HOSPITAL_BASED_OUTPATIENT_CLINIC_OR_DEPARTMENT_OTHER): Payer: Self-pay | Admitting: Emergency Medicine

## 2015-12-05 LAB — REFLEX TO GENOSURE(R) MG: HIV GenoSure(R) MG PDF: 0

## 2015-12-05 LAB — HIV-1 RNA ULTRAQUANT REFLEX TO GENTYP+
HIV-1 RNA BY PCR: 848000 {copies}/mL
HIV-1 RNA QUANT, LOG: 5.928 {Log_copies}/mL

## 2015-12-10 NOTE — Telephone Encounter (Signed)
We can reach out for intake visit if he has been informed of HIV results.   Laurell Josephsammy K Elianny Buxbaum, RN

## 2015-12-10 NOTE — Telephone Encounter (Signed)
His mobile number is not in service so we will need to wait for DIS referral.   Laurell Josephsammy K Claribel Sachs, RN

## 2016-04-23 ENCOUNTER — Encounter (HOSPITAL_COMMUNITY): Payer: Self-pay | Admitting: Emergency Medicine

## 2016-04-23 ENCOUNTER — Emergency Department (HOSPITAL_COMMUNITY)
Admission: EM | Admit: 2016-04-23 | Discharge: 2016-04-23 | Disposition: A | Discharge status: Home / Self Care | Payer: Medicaid Other | Attending: Emergency Medicine | Admitting: Emergency Medicine

## 2016-04-23 DIAGNOSIS — B20 Human immunodeficiency virus [HIV] disease: Secondary | ICD-10-CM | POA: Insufficient documentation

## 2016-04-23 DIAGNOSIS — N489 Disorder of penis, unspecified: Secondary | ICD-10-CM

## 2016-04-23 DIAGNOSIS — F17210 Nicotine dependence, cigarettes, uncomplicated: Secondary | ICD-10-CM | POA: Insufficient documentation

## 2016-04-23 HISTORY — DX: Syphilis, unspecified: A53.9

## 2016-04-23 LAB — URINALYSIS, ROUTINE W REFLEX MICROSCOPIC
BILIRUBIN URINE: NEGATIVE
Glucose, UA: NEGATIVE mg/dL
Hgb urine dipstick: NEGATIVE
KETONES UR: NEGATIVE mg/dL
Leukocytes, UA: NEGATIVE
NITRITE: NEGATIVE
PH: 7 (ref 5.0–8.0)
PROTEIN: NEGATIVE mg/dL
Specific Gravity, Urine: 1.016 (ref 1.005–1.030)

## 2016-04-23 NOTE — ED Triage Notes (Signed)
Pt reports painless small bump on penis for the past few days. Is open, but no drainage.

## 2016-04-23 NOTE — Discharge Instructions (Signed)
Dr. Daiva EvesVan Dam is the infectious disease doctor, call today to schedule an appointment to start treatment for HIV.

## 2016-04-23 NOTE — ED Provider Notes (Signed)
[facility]ER[facility] DEPT Provider Note   CSN: [id number] Arrival date & time: 04/23/16  0720     History   Chief Complaint Chief Complaint  Patient presents with  . Groin Swelling    HPI Dustin [name] is a 21 y.o. male.  HPI Patient reports that she had unprotected sex about 3-4 days ago. Since that time a very small painless ulcer was noted on the penis. Patient reports that it has not changed in size or appearance and since onset. No pain with urination. No penile drainage or discharge. No other associated symptoms. Patient denies fevers, chills or sweats. No associated abdominal pain or vomiting. Patient reports completing treatment for syphilis as prescribed at last visit. Denies having had any subsequent symptoms. Past Medical History:  Diagnosis Date  . Acute appendicitis without mention of peritonitis 12/23/2012  . Allergy   . History of nephrolithiasis 12/23/2012   He had some hematuria on admit, asymptomatic  . Kidney stones   . Syphilis (acquired)     Patient Active Problem List   Diagnosis Date Noted  . Acute appendicitis without mention of peritonitis 12/23/2012  . History of nephrolithiasis 12/23/2012  . Elevated BP 07/22/2011  . Exposure to communicable disease 07/21/2011  . ATOPIC DERMATITIS 08/15/2007  . RHINITIS, ALLERGIC 06/17/2006    Past Surgical History:  Procedure Laterality Date  . LAPAROSCOPIC APPENDECTOMY N/A 12/22/2012   Procedure: APPENDECTOMY LAPAROSCOPIC;  Surgeon: Adolph Pollack, MD;  Location: [facility];  Service: General;  Laterality: N/A;    OB History    No data available       Home Medications    Prior to Admission medications   Medication Sig Start Date End Date Taking? Authorizing Provider  clindamycin (CLEOCIN) 150 MG capsule Take 3 capsules (450 mg total) by mouth 3 (three) times daily. Patient not taking: Reported on 11/24/2015 11/13/15   Tilden Fossa, MD  doxycycline (VIBRAMYCIN) 100 MG capsule Take 1 capsule (100 mg total)  by mouth 2 (two) times daily. One po bid x 7 days Patient not taking: Reported on 04/23/2016 11/24/15   Trixie Dredge, PA-C    Family History Family History  Problem Relation Age of Onset  . Heart disease Mother   . Drug abuse Father   . Diabetes Father   . Hypertension Father   . Hyperlipidemia Father   . Kidney disease Paternal Grandmother   . Stroke Paternal Grandmother   . Diabetes Paternal Grandfather   . Hyperlipidemia Paternal Grandfather   . Hypertension Paternal Grandfather     Social History Social History  Substance Use Topics  . Smoking status: Current Every Day Smoker    Packs/day: 1.00    Types: Cigarettes  . Smokeless tobacco: Never Used  . Alcohol use 1.2 oz/week    2 Cans of beer per week     Allergies   Patient has no known allergies.   Review of Systems Review of Systems 10 Systems reviewed and are negative for acute change except as noted in the HPI.  Physical Exam Updated Vital Signs BP (!) 159/113   Pulse 84   Temp 98.4 F (36.9 C) (Oral)   Resp 16   SpO2 100%   Physical Exam  Constitutional: He is oriented to person, place, and time. He appears well-developed and well-nourished.  HENT:  Head: Normocephalic and atraumatic.  Eyes: Conjunctivae and EOM are normal.  Cardiovascular:  No murmur heard. Pulmonary/Chest: Effort normal. No respiratory distress.  Abdominal: Soft. He exhibits no distension.  Genitourinary:  Genitourinary Comments: Penis has a 2 mm, very subtle lesion just below the glans. No associated swelling or drainage. Slightly pink erosion. Remainder penis is normal. No scrotal swelling.  Musculoskeletal: Normal range of motion. He exhibits no edema.  Neurological: He is alert and oriented to person, place, and time. He exhibits normal muscle tone. Coordination normal.  Skin: Skin is warm and dry.  Psychiatric: He has a normal mood and affect.  Nursing note and vitals reviewed.    ED Treatments / Results  Labs (all labs  ordered are listed, but only abnormal results are displayed) Labs Reviewed  URINALYSIS, ROUTINE W REFLEX MICROSCOPIC - Abnormal; Notable for the following:       Result Value   Color, Urine STRAW (*)    All other components within normal limits  RPR  HSV(HERPES SMPLX)ABS-I+II(IGG+IGM)-BLD  HIV 1 RNA QUANT-NO REFLEX-BLD  GC/CHLAMYDIA PROBE AMP ([place]    EKG  EKG Interpretation None       Radiology No results found.  Procedures Procedures (including critical care time)  Medications Ordered in ED Medications - No data to display   Initial Impression / Assessment and Plan / ED Course  I have reviewed the triage vital signs and the nursing notes.  Pertinent labs & imaging results that were available during my care of the patient were reviewed by me and considered in my medical decision making (see chart for details).  Clinical Course     Final Clinical Impressions(s) / ED Diagnoses   Final diagnoses:  HIV (human immunodeficiency virus infection) (HCC)  Lesion of penis   Patient's penile lesion is very small and innocuous in appearance. Suspicion for possible HSV versus possible recurrence of chancre. Patient reports completing treatment for syphilis as prescribed upon last testing. Will retest for any active syphilis before. Treatment. We'll also test for HSV by serum for acute versus chronic infection. At this time with very innocuous lesion I did not feel that empiric treatment with acyclovir is needed. Patient reported being unaware of positive HIV status from prior testing. Notes indicate that patient had been advised to follow-up with infectious disease and attempts were made by infectious disease to contact the patient but were unsuccessful. At this visit, patient is fully aware of importance of follow-up with infectious disease clinic and is aware of positive HIV status. Patient is also made aware that the lesion may represent HSV and that suppression  treatment as an option but that can be started with infectious disease clinic based on their assessment. Patient denies having had fever chills or malaise abdominal pain or any other symptoms that would suggest necessity for immediate treatment. New Prescriptions New Prescriptions   No medications on file     Arby BarretteMarcy Demetrice Amstutz, MD 04/23/16 [phone]

## 2016-04-24 LAB — RPR: RPR: NONREACTIVE

## 2016-04-24 LAB — GC/CHLAMYDIA PROBE AMP (~~LOC~~) NOT AT ARMC
Chlamydia: NEGATIVE
NEISSERIA GONORRHEA: NEGATIVE

## 2016-04-24 LAB — HSV(HERPES SMPLX)ABS-I+II(IGG+IGM)-BLD
HSV 1 Glycoprotein G Ab, IgG: <0.91 index (ref 0.00–0.90)
HSV 2 Glycoprotein G Ab, IgG: 13.4 index — ABNORMAL HIGH (ref 0.00–0.90)

## 2016-04-24 LAB — HIV-1 RNA QUANT-NO REFLEX-BLD
HIV 1 RNA Quant: 78200 copies/mL
LOG10 HIV-1 RNA: 4.893 log10copy/mL

## 2016-05-01 ENCOUNTER — Encounter (HOSPITAL_COMMUNITY): Payer: Self-pay | Admitting: Emergency Medicine

## 2016-07-10 ENCOUNTER — Ambulatory Visit: Payer: Medicaid Other | Admitting: Infectious Diseases

## 2017-04-05 ENCOUNTER — Emergency Department (HOSPITAL_COMMUNITY): Payer: Self-pay

## 2017-04-05 ENCOUNTER — Encounter (HOSPITAL_COMMUNITY): Payer: Self-pay | Admitting: Neurology

## 2017-04-05 ENCOUNTER — Other Ambulatory Visit: Payer: Self-pay

## 2017-04-05 ENCOUNTER — Emergency Department (HOSPITAL_COMMUNITY)
Admission: EM | Admit: 2017-04-05 | Discharge: 2017-04-05 | Disposition: A | Discharge status: Home / Self Care | Payer: Self-pay | Attending: Emergency Medicine | Admitting: Emergency Medicine

## 2017-04-05 DIAGNOSIS — R21 Rash and other nonspecific skin eruption: Secondary | ICD-10-CM | POA: Insufficient documentation

## 2017-04-05 DIAGNOSIS — J039 Acute tonsillitis, unspecified: Secondary | ICD-10-CM

## 2017-04-05 DIAGNOSIS — L02224 Furuncle of groin: Secondary | ICD-10-CM

## 2017-04-05 DIAGNOSIS — Z833 Family history of diabetes mellitus: Secondary | ICD-10-CM

## 2017-04-05 DIAGNOSIS — Z21 Asymptomatic human immunodeficiency virus [HIV] infection status: Secondary | ICD-10-CM | POA: Insufficient documentation

## 2017-04-05 DIAGNOSIS — F649 Gender identity disorder, unspecified: Secondary | ICD-10-CM

## 2017-04-05 DIAGNOSIS — R591 Generalized enlarged lymph nodes: Secondary | ICD-10-CM | POA: Diagnosis present

## 2017-04-05 DIAGNOSIS — Z789 Other specified health status: Secondary | ICD-10-CM | POA: Insufficient documentation

## 2017-04-05 DIAGNOSIS — B20 Human immunodeficiency virus [HIV] disease: Secondary | ICD-10-CM | POA: Diagnosis present

## 2017-04-05 DIAGNOSIS — L0291 Cutaneous abscess, unspecified: Secondary | ICD-10-CM

## 2017-04-05 DIAGNOSIS — F329 Major depressive disorder, single episode, unspecified: Secondary | ICD-10-CM

## 2017-04-05 DIAGNOSIS — L0292 Furuncle, unspecified: Secondary | ICD-10-CM

## 2017-04-05 DIAGNOSIS — Z841 Family history of disorders of kidney and ureter: Secondary | ICD-10-CM

## 2017-04-05 DIAGNOSIS — Z8349 Family history of other endocrine, nutritional and metabolic diseases: Secondary | ICD-10-CM

## 2017-04-05 DIAGNOSIS — F640 Transsexualism: Secondary | ICD-10-CM | POA: Insufficient documentation

## 2017-04-05 DIAGNOSIS — Z8249 Family history of ischemic heart disease and other diseases of the circulatory system: Secondary | ICD-10-CM

## 2017-04-05 DIAGNOSIS — L02214 Cutaneous abscess of groin: Secondary | ICD-10-CM | POA: Insufficient documentation

## 2017-04-05 DIAGNOSIS — Z823 Family history of stroke: Secondary | ICD-10-CM

## 2017-04-05 DIAGNOSIS — Z813 Family history of other psychoactive substance abuse and dependence: Secondary | ICD-10-CM

## 2017-04-05 DIAGNOSIS — J029 Acute pharyngitis, unspecified: Secondary | ICD-10-CM | POA: Insufficient documentation

## 2017-04-05 DIAGNOSIS — F17210 Nicotine dependence, cigarettes, uncomplicated: Secondary | ICD-10-CM | POA: Insufficient documentation

## 2017-04-05 HISTORY — DX: Furuncle, unspecified: L02.92

## 2017-04-05 LAB — CBC WITH DIFFERENTIAL/PLATELET
Basophils Absolute: 0 10*3/uL (ref 0.0–0.1)
Basophils Relative: 0 %
Eosinophils Absolute: 0 10*3/uL (ref 0.0–0.7)
Eosinophils Relative: 0 %
HCT: 38.5 % — ABNORMAL LOW (ref 39.0–52.0)
Hemoglobin: 12.2 g/dL — ABNORMAL LOW (ref 13.0–17.0)
Lymphocytes Relative: 22 %
Lymphs Abs: 1.4 10*3/uL (ref 0.7–4.0)
MCH: 23.4 pg — ABNORMAL LOW (ref 26.0–34.0)
MCHC: 31.7 g/dL (ref 30.0–36.0)
MCV: 73.9 fL — ABNORMAL LOW (ref 78.0–100.0)
Monocytes Absolute: 0.4 10*3/uL (ref 0.1–1.0)
Monocytes Relative: 7 %
Neutro Abs: 4.4 10*3/uL (ref 1.7–7.7)
Neutrophils Relative %: 71 %
Platelets: 298 10*3/uL (ref 150–400)
RBC: 5.21 MIL/uL (ref 4.22–5.81)
RDW: 14.5 % (ref 11.5–15.5)
WBC: 6.2 10*3/uL (ref 4.0–10.5)

## 2017-04-05 LAB — BASIC METABOLIC PANEL
Anion gap: 9 (ref 5–15)
BUN: 9 mg/dL (ref 6–20)
CO2: 25 mmol/L (ref 22–32)
Calcium: 8.7 mg/dL — ABNORMAL LOW (ref 8.9–10.3)
Chloride: 105 mmol/L (ref 101–111)
Creatinine, Ser: 1.18 mg/dL (ref 0.61–1.24)
GFR calc Af Amer: >60 mL/min (ref >60)
GFR calc non Af Amer: >60 mL/min (ref >60)
Glucose, Bld: 94 mg/dL (ref 65–99)
Potassium: 3.7 mmol/L (ref 3.5–5.1)
Sodium: 139 mmol/L (ref 135–145)

## 2017-04-05 LAB — RAPID STREP SCREEN (MED CTR MEBANE ONLY): Streptococcus, Group A Screen (Direct): NEGATIVE

## 2017-04-05 MED ORDER — IBUPROFEN 600 MG PO TABS: 600.0000 mg | ORAL_TABLET | Freq: Four times a day (QID) | ORAL | 0 refills | Status: DC | PRN

## 2017-04-05 MED ORDER — CLINDAMYCIN HCL 150 MG PO CAPS
450.0000 mg | ORAL_CAPSULE | Freq: Once | ORAL | Status: AC
  Administered 2017-04-05: 450 mg via ORAL
  Filled 2017-04-05: qty 3

## 2017-04-05 MED ORDER — IOPAMIDOL (ISOVUE-300) INJECTION 61%
INTRAVENOUS | Status: AC
  Administered 2017-04-05: 75 mL
  Filled 2017-04-05: qty 75

## 2017-04-05 MED ORDER — KETOROLAC TROMETHAMINE 30 MG/ML IJ SOLN
30.0000 mg | Freq: Once | INTRAMUSCULAR | Status: AC
  Administered 2017-04-05: 30 mg via INTRAVENOUS
  Filled 2017-04-05: qty 1

## 2017-04-05 MED ORDER — ACETAMINOPHEN 500 MG PO TABS: 500.0000 mg | ORAL_TABLET | Freq: Four times a day (QID) | ORAL | 0 refills | Status: DC | PRN

## 2017-04-05 MED ORDER — CLINDAMYCIN HCL 150 MG PO CAPS: 450.0000 mg | ORAL_CAPSULE | Freq: Three times a day (TID) | ORAL | 0 refills | Status: DC

## 2017-04-05 MED ORDER — LIDOCAINE HCL (PF) 1 % IJ SOLN
INTRAMUSCULAR | Status: AC
  Administered 2017-04-05: 5 mL
  Filled 2017-04-05: qty 5

## 2017-04-05 MED ORDER — AZITHROMYCIN 250 MG PO TABS
1000.0000 mg | ORAL_TABLET | Freq: Once | ORAL | Status: AC
  Administered 2017-04-05: 1000 mg via ORAL
  Filled 2017-04-05: qty 4

## 2017-04-05 MED ORDER — CEFTRIAXONE SODIUM 250 MG IJ SOLR
250.0000 mg | Freq: Once | INTRAMUSCULAR | Status: AC
  Administered 2017-04-05: 250 mg via INTRAMUSCULAR
  Filled 2017-04-05: qty 250
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
## 2017-04-05 NOTE — ED Triage Notes (Signed)
Pt reports sore throat x several days. Rash to left groin, that popped, but is still present. Has been using warm compresses. Diffuse, raised papular rash to arms and chest, is not itchy.

## 2017-04-05 NOTE — ED Notes (Signed)
Pt given information relayed to RN from ID [facility]. Pt's phone number listed in Epic was verified with pt as her own.

## 2017-04-05 NOTE — Discharge Instructions (Signed)
Medications: Clindamycin, ibuprofen, Tylenol  Treatment: Take clindamycin 3 times daily for 1 week as prescribed.  Take ibuprofen every 6 hours as needed for your pain.  You can alternate with Tylenol as prescribed.  Follow-up: Please follow-up with the infectious disease clinic at your scheduled appointment on Wednesday at 2 PM.  Please return to the emergency department if you develop any new or worsening symptoms between now and then.

## 2017-04-05 NOTE — ED Provider Notes (Signed)
Medical screening examination/treatment/procedure(s) were conducted as a shared visit with non-physician practitioner(s) and myself.  I personally evaluated the patient during the encounter. Briefly, the patient is a very pleasant transgender male to male who presents with severe sore throat.  Patient was recently diagnosed with HIV and has been lost to follow-up.  On exam, she has marketed tonsillar swelling and exudates of the left tonsil.  This is likely infectious.  It is not consistent with Candida.  Less likely gonorrhea or gonococcus given that she did not declines any oral sex in the last several months, but will treat empirically given her high risk features.  Otherwise, will obtain scan and imaging.  The patient feels otherwise well.  I had a long discussion with her regarding the need for follow-up.  I will also notify infectious disease that the patient is here, as she is high risk for lost to follow-up and I feel is best to coordinate her resources while she is here..   EKG Interpretation None          Dustin PollackIsaacs, Layali Freund, MD 04/05/17 1236

## 2017-04-05 NOTE — ED Notes (Signed)
ED Provider at bedside. 

## 2017-04-05 NOTE — Consult Note (Signed)
Date of Admission:  04/05/2017          Reason for Consult: HIV disease, pharyngitis, boil   Referring Provider: Buel ReamAlexandra [name] and Dustin Phenixarmeron Dustin [name]   Assessment: 1. HIV disease with acute HIV infection in July of 2017, but never engaged in care 2. Suppurative pharyngitis with necrotizing lymphadenitis 3. Rash 4. Boil in groin 5. Transgender identity  Plan: 1. Agree with treating presumptively for GC with Ceftriaxone and azithromycin, and also giving clindamycin x  7-10 days 2. Clindamycin also  Reasonable for coverage of groin furuncle unless it is due to MRSA with inducible cilnda R (I would ask if pharmacy can provide clindamycin for her to take home to ensure she gets this antibiotic 3. I have arranged for her to see Dustin [name] in our Clinic at [address] [facility] [address] at 2pm. We will get HMAP and Dustin [name] Paperwork done at that time and initiate ARV ( I would use [facility] and fill at [facility] using [employer] patient assistance which can be deployed HMAP is being processed 4. Followup on rash 5. We can assist her with gender affirming therapies and certainly the chart should include her preferred name Dustin [name] as well as her gender identity    Active Problems:   Tonsillitis   HIV disease ([facility])   Lymphadenopathy   Rash   Furuncle   Scheduled Meds: . clindamycin  450 mg Oral Once   Continuous Infusions: PRN Meds:.  HPI: Dustin [name] is a 21 y.o. transgender male who was diagnosed with acute HIV infection in July 2017. (Note her HIV fourth-generation assay was positive, discriminatory assay was negative and qualitative RNA not run on November 13, 2015, then subsequent testing on August 6 revealed confirmatory assay positive for HIV 1 and a viral load of greater than 848,000 copies)v and HIV genotype was performed on that specimen though I cannot access it in epic at present.  We attempted to get her into our clinic urgently when we were alerted via the  [employer] system but her phone number that was listed did not work and we were not able at her into clinic which is a pity because we could have potentially prevented further transmission of the HIV esp when she was acutely infected IF she was sexually active. She was seen by [facility] she says but she struggled very much with the stigma of the diagnosis and coming to terms with this.  [place] after [place].  She has been living with her mother who brought her to the emergency department.  In the last 5 days she had developed severe left-sided throat and neck pain, with an obvious exudative pharyngitis.  She was having pain with swallowing as well as a new rash on her arms.  She claimed to not been sexually active since May 2018.  In the ER she has had blood work as well as testing for gonorrhea and chlamydia from oropharynx as well as a rapid strep test which was negative.  She has been given ceftriaxone and azithromycin.  She has had a CT of the head and neck which shows a necrotizing lymphadenitis.  She also has noticed a boil in the groin that has drained purulent material.  I spent 110 patients working with the patient including greater than 50% of time inf ace to face counseling of the patient in the nature of HIV infection and how HIV affects  the immune system via attacking CD4 cells, the data on increased mortality in untreated HIV including those with normal immune systems, how we treat HIV and the types of regimens that we use, how we can restore and preserve immune health and also render someone un- infectious once their viral load becomes undetectable (U=U), re how HMAP, RW programs work, re workup and in coordination of her care with Dustin [name], pharmacy and bridge counselor Dustin [name].     Review of Systems: Review of Systems  Constitutional: Positive for malaise/fatigue. Negative for chills, diaphoresis, fever and weight loss.    HENT: Positive for sore throat. Negative for congestion, hearing loss and tinnitus.   Eyes: Negative for blurred vision and double vision.  Respiratory: Negative for cough, sputum production, shortness of breath and wheezing.   Cardiovascular: Negative for chest pain, palpitations and leg swelling.  Gastrointestinal: Negative for abdominal pain, blood in stool, constipation, diarrhea, heartburn, melena, nausea and vomiting.  Genitourinary: Negative for dysuria, flank pain and hematuria.  Musculoskeletal: Negative for back pain, falls, joint pain and myalgias.  Skin: Positive for rash. Negative for itching.  Neurological: Negative for dizziness, sensory change, focal weakness, loss of consciousness, weakness and headaches.  Endo/Heme/Allergies: Does not bruise/bleed easily.  Psychiatric/Behavioral: Positive for depression. Negative for memory loss and suicidal ideas. The patient is not nervous/anxious.     Past Medical History:  Diagnosis Date  . Acute appendicitis without mention of peritonitis 12/23/2012  . Allergy   . Furuncle 04/05/2017  . History of nephrolithiasis 12/23/2012   He had some hematuria on admit, asymptomatic  . Kidney stones   . Syphilis (acquired)     Social History   Tobacco Use  . Smoking status: Current Every Day Smoker    Packs/day: 1.00    Types: Cigarettes  . Smokeless tobacco: Never Used  Substance Use Topics  . Alcohol use: Yes    Alcohol/week: 1.2 oz    Types: 2 Cans of beer per week  . Drug use: No    Family History  Problem Relation Age of Onset  . Heart disease Mother   . Drug abuse Father   . Diabetes Father   . Hypertension Father   . Hyperlipidemia Father   . Kidney disease Paternal Grandmother   . Stroke Paternal Grandmother   . Diabetes Paternal Grandfather   . Hyperlipidemia Paternal Grandfather   . Hypertension Paternal Grandfather    No Known Allergies  OBJECTIVE: Blood pressure (!) 162/108, pulse 95, temperature 99.7 F (37.6  C), temperature source Oral, resp. rate 18, height 6\' 5"  (1.956 m), SpO2 98 %.  Physical Exam  Constitutional: He is oriented to person, place, and time and well-developed, well-nourished, and in no distress. No distress.  HENT:  Head: Normocephalic and atraumatic.  Right Ear: External ear normal.  Left Ear: External ear normal.  Nose: Nose normal.  Mouth/Throat: Uvula is midline. Oropharyngeal exudate, posterior oropharyngeal edema and posterior oropharyngeal erythema present.  Eyes: Conjunctivae and EOM are normal. Pupils are equal, round, and reactive to light. No scleral icterus.  Neck: Normal range of motion. Neck supple.    Cardiovascular: Normal rate, regular rhythm and normal heart sounds. Exam reveals no gallop and no friction rub.  No murmur heard. Pulmonary/Chest: Effort normal and breath sounds normal. No respiratory distress. He has no wheezes. He has no rales.  Abdominal: Soft. Bowel sounds are normal. He exhibits no distension. There is no tenderness. There is no rebound.  Musculoskeletal: Normal range of motion.  He exhibits no edema or tenderness.  Lymphadenopathy:    He has cervical adenopathy.  Neurological: He is alert and oriented to person, place, and time. Gait normal. Coordination normal.  Skin: Skin is warm and dry. Rash noted. He is not diaphoretic. No erythema. No pallor.  Psychiatric: Mood, memory, affect and judgment normal.  Nursing note and vitals reviewed.  Rash on arms almost petechial 04/02/17:     Groin suppuratation in groin      Lab Results Lab Results  Component Value Date   WBC 6.2 04/05/2017   HGB 12.2 (L) 04/05/2017   HCT 38.5 (L) 04/05/2017   MCV 73.9 (L) 04/05/2017   PLT 298 04/05/2017    Lab Results  Component Value Date   CREATININE 1.18 04/05/2017   BUN 9 04/05/2017   NA 139 04/05/2017   K 3.7 04/05/2017   CL 105 04/05/2017   CO2 25 04/05/2017    Lab Results  Component Value Date   ALT 14 (L) 11/13/2015   AST 15  11/13/2015   ALKPHOS 95 11/13/2015   BILITOT 0.3 11/13/2015     Microbiology: Recent Results (from the past 240 hour(s))  Rapid strep screen     Status: None   Collection Time: 04/05/17  9:23 AM  Result Value Ref Range Status   Streptococcus, Group A Screen (Direct) NEGATIVE NEGATIVE Final    Comment: (NOTE) A Rapid Antigen test may result negative if the antigen level in the sample is below the detection level of this test. The FDA has not cleared this test as a stand-alone test therefore the rapid antigen negative result has reflexed to a Group A Strep culture.     Acey Lav, MD [facility] [phone] pager   [phone] cell 04/05/2017, 4:50 PM

## 2017-04-06 LAB — HIV-1 RNA QUANT-NO REFLEX-BLD
HIV 1 RNA Quant: 20300 copies/mL
LOG10 HIV-1 RNA: 4.307 log10copy/mL

## 2017-04-06 LAB — HEPATITIS B SURFACE ANTIBODY,QUALITATIVE: Hep B S Ab: NONREACTIVE

## 2017-04-06 LAB — GC/CHLAMYDIA PROBE AMP (~~LOC~~) NOT AT ARMC
Chlamydia: NEGATIVE
Neisseria Gonorrhea: NEGATIVE

## 2017-04-06 LAB — CD4/CD8 (T-HELPER/T-SUPPRESSOR CELL)
CD4 absolute: 320 /uL — ABNORMAL LOW (ref 500–1900)
CD4%: 22 % — AB (ref 30.0–60.0)
CD8 T Cell Abs: 500 /uL (ref 230–1000)
CD8tox: 33 % (ref 15.0–40.0)
Ratio: 0.64 — ABNORMAL LOW (ref 1.0–3.0)
TOTAL LYMPHOCYTE COUNT: 1500 /uL (ref 1000–4000)

## 2017-04-06 LAB — HEPATITIS A ANTIBODY, TOTAL: Hep A Total Ab: POSITIVE — AB

## 2017-04-06 LAB — HEPATITIS B SURFACE ANTIGEN: Hepatitis B Surface Ag: NEGATIVE

## 2017-04-07 ENCOUNTER — Ambulatory Visit (INDEPENDENT_AMBULATORY_CARE_PROVIDER_SITE_OTHER): Payer: Self-pay | Admitting: Infectious Diseases

## 2017-04-07 ENCOUNTER — Encounter: Payer: Self-pay | Admitting: Infectious Diseases

## 2017-04-07 DIAGNOSIS — F640 Transsexualism: Secondary | ICD-10-CM

## 2017-04-07 DIAGNOSIS — B20 Human immunodeficiency virus [HIV] disease: Secondary | ICD-10-CM

## 2017-04-07 DIAGNOSIS — J039 Acute tonsillitis, unspecified: Secondary | ICD-10-CM

## 2017-04-07 DIAGNOSIS — Z8679 Personal history of other diseases of the circulatory system: Secondary | ICD-10-CM

## 2017-04-07 DIAGNOSIS — Z789 Other specified health status: Secondary | ICD-10-CM

## 2017-04-07 DIAGNOSIS — L0292 Furuncle, unspecified: Secondary | ICD-10-CM

## 2017-04-07 LAB — CULTURE, GROUP A STREP (THRC)

## 2017-04-07 MED ORDER — BICTEGRAVIR-EMTRICITAB-TENOFOV 50-200-25 MG PO TABS: 1.0000 | ORAL_TABLET | Freq: Every day | ORAL | 5 refills | Status: DC

## 2017-04-07 NOTE — Assessment & Plan Note (Addendum)
New patient here to establish for HIV care. I discussed with Raquel treatment options/side effects, benefits of treatment and long-term outcomes. I discussed how HIV is transmitted and the process of untreated HIV including increased risk for opportunistic infections, cancer, dementia and renal failure. She was counseled on routine HIV care including medication adherence, blood monitoring, necessary vaccines and follow up visits. Counseled regarding safe sex practices including: condom use, partner disclosure, limiting partners and potential for PrEP. She spent time talking with our pharmacy team regarding successful practices of ART and understands to reach out to our clinic in the future with questions.   Met with THP today.   Will start her on Biktarvy today. She will receive medications from [facility] via [place] [facility] program until her ADAP/RW is approved. She understands she needs to reapply for this again soon as re-enrollment is mandatory every 6 months.  I spent greater than 30 minutes with the patient today. Greater than 50% of the time spent face-to-face counseling and coordination of care re: HIV and health maintenance.   Will have her return in 4 weeks to follow up with  Pharmacy team - I have asked our lab tech to reach out to [facility] again to request printed genotype report. May need to have her come back to re-check genotype.

## 2017-04-07 NOTE — Assessment & Plan Note (Signed)
Resolved on clindamycin therapy.

## 2017-04-07 NOTE — Assessment & Plan Note (Signed)
Uncertain if this is situational or will be chronic for her. Counseled on smoking cessation and that we will continue to monitor BP for her with routine visits.

## 2017-04-07 NOTE — Patient Instructions (Signed)
Nice to meet you today.   Biktarvy is the pill we would like for you to start taking - this will need to be taken once a day around the same time.  - Common side effects for a short time frame usually include headaches, nausea and diarrhea - OK to take over the counter tylenol for headaches and imodium for diarrhea - Try taking with food if you are nauseated   Will get you access to medications as soon as possible.   Will have you meet with our pharmacy team again in 4 weeks and with Dr. Daiva EvesVan Dam again in 2 months.

## 2017-04-07 NOTE — Progress Notes (Signed)
"Dustin [name]" Dustin [name]  11-01-1995 [id number] Patient, No Pcp Per   Reason for Visit: Routine HIV Care - Initial Visit   Patient Active Problem List   Diagnosis Date Noted  . HIV disease ([place])   . [name]   . Lymphadenopathy   . Rash   . Acute appendicitis without mention of peritonitis 12/23/2012  . History of nephrolithiasis 12/23/2012  . History of elevated blood pressure while in hospital 07/22/2011  . Exposure to communicable disease 07/21/2011  . Tonsillitis 08/30/2007  . ATOPIC DERMATITIS 08/15/2007  . RHINITIS, ALLERGIC 06/17/2006   HPI:  Dustin [name] is a 21 y.o. AA transfemale with HIV infection. Originally diagnosed with acute HIV in 11/2015. CD4 nadir 320. She struggled with the stigma of the diagnosis and was not ready for enrollment in care at that time. She was seen by Dr. Tommy Medal during recent ED visit where she sought care for pharyngitis/neck pain and boil to her groin. Was treated empirically for GC/C and clindamycin for furuncle. During this visit Dr. Tommy Medal discussed treatment, prognosis and need for medications. She is not currently on hormone therapy and interested in starting on them once she gets established here for HIV care.   CD4 nadir 320. History of OIs: none.   HIV Risk: transfemale / MSM unprotected sex  Genotype: 11/2015, 04/2016- awaiting results to be faxed from [facility]  She tells me that although it is still a little difficult to swallow she is much improved and swelling to her throat has gone down significantly. Boil has resolved. Reports no other complaints today suggestive of associated opportunistic infection or advancing HIV disease such as fevers, night sweats, weight loss, anorexia, cough, SOB, nausea, vomiting, diarrhea, headache, sensory changes, lymphadenopathy or oral thrush. Tells me she has adequate social support and stable housing. Does not use illegal drugs aside from marijuana. Currently reports she is not  sexually active but prefers male partners. Condom use was infrequent.   Reviewed ED visit - GAS, GC/C tests all negative. Does not have any chronic health conditions, however has noticed her BP has been elevated lately.     Health Maintenance = does not have PCP and un-insured.   Review of Systems: Review of Systems  Constitutional: Negative for chills, fever, malaise/fatigue and weight loss.  HENT: Positive for sore throat.        No dental problems  Respiratory: Negative for cough and sputum production.   Cardiovascular: Negative for chest pain and leg swelling.  Gastrointestinal: Negative for abdominal pain, diarrhea and vomiting.  Genitourinary: Negative for dysuria and flank pain.  Musculoskeletal: Negative for joint pain, myalgias and neck pain.  Skin: Negative for rash.  Neurological: Negative for dizziness, tingling and headaches.  Psychiatric/Behavioral: Negative for depression and substance abuse. The patient is not nervous/anxious and does not have insomnia.     Past Medical History:  Diagnosis Date  . Acute appendicitis without mention of peritonitis 12/23/2012  . Allergy   . Furuncle 04/05/2017  . History of nephrolithiasis 12/23/2012   He had some hematuria on admit, asymptomatic  . Kidney stones   . Syphilis (acquired)    Outpatient Medications Prior to Visit  Medication Sig Dispense Refill  . acetaminophen (TYLENOL) 500 MG tablet Take 1 tablet (500 mg total) by mouth every 6 (six) hours as needed. 30 tablet 0  . cetirizine (ZYRTEC) 10 MG tablet Take 10 mg by mouth as needed for allergies or rhinitis.    . clindamycin (CLEOCIN) 150  MG capsule Take 3 capsules (450 mg total) by mouth 3 (three) times daily. 63 capsule 0  . doxycycline (VIBRAMYCIN) 100 MG capsule Take 1 capsule (100 mg total) by mouth 2 (two) times daily. One po bid x 7 days (Patient not taking: Reported on 04/23/2016) 14 capsule 0  . ibuprofen (ADVIL,MOTRIN) 600 MG tablet Take 1 tablet (600 mg total) by  mouth every 6 (six) hours as needed. (Patient not taking: Reported on 04/07/2017) 30 tablet 0  . OVER THE COUNTER MEDICATION Take 2-4 capsules by mouth as needed (Cough and Cold). Cough and Cold Gel Capsules    . OVER THE COUNTER MEDICATION Apply 1 application topically daily. Antibacterial Cream     No facility-administered medications prior to visit.    No Known Allergies  Social History   Tobacco Use  . Smoking status: Current Every Day Smoker    Packs/day: 1.00    Types: Cigarettes  . Smokeless tobacco: Never Used  Substance Use Topics  . Alcohol use: Yes    Alcohol/week: 1.2 oz    Types: 2 Cans of beer per week  . Drug use: Yes    Types: Marijuana    Family History  Problem Relation Age of Onset  . Heart disease Mother   . Drug abuse Father   . Diabetes Father   . Hypertension Father   . Hyperlipidemia Father   . Kidney disease Paternal Grandmother   . Stroke Paternal Grandmother   . Diabetes Paternal Grandfather   . Hyperlipidemia Paternal Grandfather   . Hypertension Paternal Grandfather     Social History   Substance and Sexual Activity  Sexual Activity Yes  Male Partners, inconsistent condom use  Physical Exam and Objective Findings:  Vitals:   04/07/17 1355  BP: (!) 147/92  Pulse: 84  Temp: 97.8 F (36.6 C)  TempSrc: Oral  Weight: 227 lb (103 kg)  Height: '6\' 5"'$  (1.956 m)   Body mass index is 26.92 kg/m.  Physical Exam  Constitutional: He is oriented to person, place, and time and well-developed, well-nourished, and in no distress.  HENT:  Mouth/Throat: No oral lesions. Normal dentition. No dental caries. Posterior oropharyngeal edema and posterior oropharyngeal erythema (no purulence ) present.  Eyes: No scleral icterus.  Cardiovascular: Normal rate, regular rhythm and normal heart sounds.  Pulmonary/Chest: Effort normal and breath sounds normal.  Abdominal: Soft. He exhibits no distension. There is no tenderness.  Lymphadenopathy:    He has  cervical adenopathy.  Neurological: He is alert and oriented to person, place, and time.  Skin: Skin is warm and dry. No rash noted.  Psychiatric: Mood and affect normal.    Lab Results Lab Results  Component Value Date   WBC 6.2 04/05/2017   HGB 12.2 (L) 04/05/2017   HCT 38.5 (L) 04/05/2017   MCV 73.9 (L) 04/05/2017   PLT 298 04/05/2017    Lab Results  Component Value Date   CREATININE 1.18 04/05/2017   BUN 9 04/05/2017   NA 139 04/05/2017   K 3.7 04/05/2017   CL 105 04/05/2017   CO2 25 04/05/2017    Lab Results  Component Value Date   ALT 14 (L) 11/13/2015   AST 15 11/13/2015   ALKPHOS 95 11/13/2015   BILITOT 0.3 11/13/2015    No results found for: CHOL, HDL, LDLCALC, LDLDIRECT, TRIG, CHOLHDL HIV 1 RNA Quant (copies/mL)  Date Value  04/05/2017 20,300  04/23/2016 78,200   Lab Results  Component Value Date   HAV Positive (  A) 04/05/2017   Lab Results  Component Value Date   HEPBSAG Negative 04/05/2017   HEPBSAB Non Reactive 04/05/2017   No results found for: HCVAB Lab Results  Component Value Date   CHLAMYDIAWP Negative 04/05/2017   N Negative 04/05/2017   No results found for: GCPROBEAPT No results found for: QUANTGOLD No results found for: RPR    Problem List Items Addressed This Visit      Respiratory   Tonsillitis    Improving after empiric tx for GC/C and on clindamycin therapy. Doubt esophageal candidiasis with persistent complaint of dysphagia as her CD4 is > 200 and no oropharyngeal thrush noted.         Musculoskeletal and Integument   RESOLVED: Boil    Resolved on clindamycin therapy.       Relevant Medications   bictegravir-emtricitabine-tenofovir AF (BIKTARVY) 50-200-25 MG TABS tablet     Other   History of elevated blood pressure while in hospital    Uncertain if this is situational or will be chronic for her. Counseled on smoking cessation and that we will continue to monitor BP for her with routine visits.       HIV disease  [facility])    New patient here to establish for HIV care. I discussed with Dustin [name] treatment options/side effects, benefits of treatment and long-term outcomes. I discussed how HIV is transmitted and the process of untreated HIV including increased risk for opportunistic infections, cancer, dementia and renal failure. She was counseled on routine HIV care including medication adherence, blood monitoring, necessary vaccines and follow up visits. Counseled regarding safe sex practices including: condom use, partner disclosure, limiting partners and potential for PrEP. She spent time talking with our pharmacy team regarding successful practices of ART and understands to reach out to our clinic in the future with questions.   Met with THP today.   Will start her on Biktarvy today. She will receive medications from [facility] via [place] program until her ADAP/RW is approved. She understands she needs to reapply for this again soon as re-enrollment is mandatory every 6 months.  I spent greater than 30 minutes with the patient today. Greater than 50% of the time spent face-to-face counseling and coordination of care re: HIV and health maintenance.   Will have her return in 4 weeks to follow up with  Pharmacy team - I have asked our lab tech to reach out to [facility] again to request printed genotype report. May need to have her come back to re-check genotype.       Relevant Medications   bictegravir-emtricitabine-tenofovir AF (BIKTARVY) 50-200-25 MG TABS tablet   [name]    Identifies as male gender and physically presents consistent with this. Discussed briefly hormone therapy is an option for transition however we need to address her HIV infection first. Will need smoking cessation/reduction counseling as we near this treatment to reduce thromboembolic risk of HRT.          Janene Madeira, MSN, NP-C [facility] [place] Pager: [phone]  04/07/2017  5:23 PM

## 2017-04-07 NOTE — Assessment & Plan Note (Signed)
Identifies as male gender and physically presents consistent with this. Discussed briefly hormone therapy is an option for transition however we need to address her HIV infection first. Will need smoking cessation/reduction counseling as we near this treatment to reduce thromboembolic risk of HRT.

## 2017-04-07 NOTE — Assessment & Plan Note (Addendum)
Improving after empiric tx for GC/C and on clindamycin therapy. Doubt esophageal candidiasis with persistent complaint of dysphagia as her CD4 is > 200 and no oropharyngeal thrush noted.

## 2017-04-08 ENCOUNTER — Encounter: Payer: Self-pay | Admitting: Infectious Diseases

## 2017-04-12 LAB — HLA B*5701: HLA B 5701: NEGATIVE

## 2017-04-21 ENCOUNTER — Encounter: Payer: Self-pay | Admitting: Licensed Clinical Social Worker

## 2017-05-11 ENCOUNTER — Ambulatory Visit: Payer: Self-pay

## 2017-05-21 ENCOUNTER — Encounter: Payer: Self-pay | Admitting: *Deleted

## 2017-06-21 ENCOUNTER — Ambulatory Visit: Payer: Self-pay | Admitting: Infectious Diseases

## 2017-06-21 ENCOUNTER — Telehealth: Payer: Self-pay

## 2017-06-21 NOTE — Telephone Encounter (Signed)
Called pt to reschedule missed appt today. Unable to speak with pt directly asked that the pt call the office back to make a new appt.  Lorenso CourierJose L Maldonado, [place]

## 2018-03-16 ENCOUNTER — Emergency Department (HOSPITAL_COMMUNITY): Payer: Self-pay

## 2018-03-16 ENCOUNTER — Encounter (HOSPITAL_COMMUNITY): Payer: Self-pay | Admitting: Emergency Medicine

## 2018-03-16 ENCOUNTER — Other Ambulatory Visit: Payer: Self-pay

## 2018-03-16 ENCOUNTER — Inpatient Hospital Stay (HOSPITAL_COMMUNITY)
Admission: EM | Admit: 2018-03-16 | Discharge: 2018-03-18 | DRG: 976 | Disposition: A | Discharge status: Home / Self Care | Payer: Self-pay | Attending: Internal Medicine | Admitting: Internal Medicine

## 2018-03-16 DIAGNOSIS — R402252 Coma scale, best verbal response, oriented, at arrival to emergency department: Secondary | ICD-10-CM | POA: Diagnosis present

## 2018-03-16 DIAGNOSIS — F17210 Nicotine dependence, cigarettes, uncomplicated: Secondary | ICD-10-CM

## 2018-03-16 DIAGNOSIS — B20 Human immunodeficiency virus [HIV] disease: Principal | ICD-10-CM | POA: Diagnosis present

## 2018-03-16 DIAGNOSIS — Z7289 Other problems related to lifestyle: Secondary | ICD-10-CM

## 2018-03-16 DIAGNOSIS — Z8349 Family history of other endocrine, nutritional and metabolic diseases: Secondary | ICD-10-CM

## 2018-03-16 DIAGNOSIS — K649 Unspecified hemorrhoids: Secondary | ICD-10-CM | POA: Diagnosis present

## 2018-03-16 DIAGNOSIS — J189 Pneumonia, unspecified organism: Secondary | ICD-10-CM | POA: Diagnosis present

## 2018-03-16 DIAGNOSIS — F649 Gender identity disorder, unspecified: Secondary | ICD-10-CM | POA: Diagnosis present

## 2018-03-16 DIAGNOSIS — Z8249 Family history of ischemic heart disease and other diseases of the circulatory system: Secondary | ICD-10-CM

## 2018-03-16 DIAGNOSIS — Z789 Other specified health status: Secondary | ICD-10-CM | POA: Diagnosis present

## 2018-03-16 DIAGNOSIS — R768 Other specified abnormal immunological findings in serum: Secondary | ICD-10-CM

## 2018-03-16 DIAGNOSIS — J181 Lobar pneumonia, unspecified organism: Secondary | ICD-10-CM

## 2018-03-16 DIAGNOSIS — F17200 Nicotine dependence, unspecified, uncomplicated: Secondary | ICD-10-CM | POA: Diagnosis present

## 2018-03-16 DIAGNOSIS — R402142 Coma scale, eyes open, spontaneous, at arrival to emergency department: Secondary | ICD-10-CM | POA: Diagnosis present

## 2018-03-16 DIAGNOSIS — R402362 Coma scale, best motor response, obeys commands, at arrival to emergency department: Secondary | ICD-10-CM | POA: Diagnosis present

## 2018-03-16 DIAGNOSIS — F640 Transsexualism: Secondary | ICD-10-CM

## 2018-03-16 DIAGNOSIS — B59 Pneumocystosis: Secondary | ICD-10-CM | POA: Diagnosis present

## 2018-03-16 DIAGNOSIS — Z8619 Personal history of other infectious and parasitic diseases: Secondary | ICD-10-CM

## 2018-03-16 DIAGNOSIS — Z833 Family history of diabetes mellitus: Secondary | ICD-10-CM

## 2018-03-16 DIAGNOSIS — Z823 Family history of stroke: Secondary | ICD-10-CM

## 2018-03-16 DIAGNOSIS — Z813 Family history of other psychoactive substance abuse and dependence: Secondary | ICD-10-CM

## 2018-03-16 DIAGNOSIS — D509 Iron deficiency anemia, unspecified: Secondary | ICD-10-CM | POA: Diagnosis present

## 2018-03-16 HISTORY — DX: Asymptomatic human immunodeficiency virus (hiv) infection status: Z21

## 2018-03-16 HISTORY — DX: Human immunodeficiency virus (HIV) disease: B20

## 2018-03-16 LAB — RESPIRATORY PANEL BY PCR
Adenovirus: NOT DETECTED
Bordetella pertussis: NOT DETECTED
CHLAMYDOPHILA PNEUMONIAE-RVPPCR: NOT DETECTED
CORONAVIRUS OC43-RVPPCR: NOT DETECTED
Coronavirus 229E: NOT DETECTED
Coronavirus HKU1: NOT DETECTED
Coronavirus NL63: NOT DETECTED
INFLUENZA B-RVPPCR: NOT DETECTED
Influenza A: NOT DETECTED
MYCOPLASMA PNEUMONIAE-RVPPCR: NOT DETECTED
Metapneumovirus: NOT DETECTED
PARAINFLUENZA VIRUS 1-RVPPCR: NOT DETECTED
Parainfluenza Virus 2: NOT DETECTED
Parainfluenza Virus 3: NOT DETECTED
Parainfluenza Virus 4: NOT DETECTED
RESPIRATORY SYNCYTIAL VIRUS-RVPPCR: NOT DETECTED
Rhinovirus / Enterovirus: NOT DETECTED

## 2018-03-16 LAB — T-HELPER CELLS (CD4) COUNT (NOT AT ARMC)
CD4 % Helper T Cell: 5 % — ABNORMAL LOW (ref 33–55)
CD4 T CELL ABS: 30 /uL — AB (ref 400–2700)

## 2018-03-16 LAB — CBC WITH DIFFERENTIAL/PLATELET
ABS IMMATURE GRANULOCYTES: 0.04 10*3/uL (ref 0.00–0.07)
BASOS ABS: 0 10*3/uL (ref 0.0–0.1)
BASOS PCT: 0 %
Eosinophils Absolute: 0.1 10*3/uL (ref 0.0–0.5)
Eosinophils Relative: 1 %
HCT: 31.7 % — ABNORMAL LOW (ref 39.0–52.0)
Hemoglobin: 9.2 g/dL — ABNORMAL LOW (ref 13.0–17.0)
Immature Granulocytes: 1 %
Lymphocytes Relative: 13 %
Lymphs Abs: 0.7 10*3/uL (ref 0.7–4.0)
MCH: 21 pg — AB (ref 26.0–34.0)
MCHC: 29 g/dL — AB (ref 30.0–36.0)
MCV: 72.4 fL — ABNORMAL LOW (ref 80.0–100.0)
MONO ABS: 0.5 10*3/uL (ref 0.1–1.0)
Monocytes Relative: 9 %
NEUTROS ABS: 3.9 10*3/uL (ref 1.7–7.7)
Neutrophils Relative %: 76 %
PLATELETS: 280 10*3/uL (ref 150–400)
RBC: 4.38 MIL/uL (ref 4.22–5.81)
RDW: 16.5 % — ABNORMAL HIGH (ref 11.5–15.5)
WBC: 5.2 10*3/uL (ref 4.0–10.5)
nRBC: 0.4 % — ABNORMAL HIGH (ref 0.0–0.2)

## 2018-03-16 LAB — LACTATE DEHYDROGENASE: LDH: 311 U/L — ABNORMAL HIGH (ref 98–192)

## 2018-03-16 LAB — I-STAT CG4 LACTIC ACID, ED: Lactic Acid, Venous: 0.87 mmol/L (ref 0.5–1.9)

## 2018-03-16 LAB — BASIC METABOLIC PANEL
Anion gap: 11 (ref 5–15)
BUN: 7 mg/dL (ref 6–20)
CO2: 19 mmol/L — ABNORMAL LOW (ref 22–32)
CREATININE: 1.1 mg/dL (ref 0.61–1.24)
Calcium: 8.1 mg/dL — ABNORMAL LOW (ref 8.9–10.3)
Chloride: 101 mmol/L (ref 98–111)
GFR calc Af Amer: >60 mL/min (ref >60)
Glucose, Bld: 99 mg/dL (ref 70–99)
POTASSIUM: 3.5 mmol/L (ref 3.5–5.1)
SODIUM: 131 mmol/L — AB (ref 135–145)

## 2018-03-16 LAB — INFLUENZA PANEL BY PCR (TYPE A & B)
INFLAPCR: NEGATIVE
INFLBPCR: NEGATIVE

## 2018-03-16 LAB — GROUP A STREP BY PCR: GROUP A STREP BY PCR: NOT DETECTED

## 2018-03-16 LAB — GLUCOSE, CAPILLARY: Glucose-Capillary: 82 mg/dL (ref 70–99)

## 2018-03-16 MED ORDER — SULFAMETHOXAZOLE-TRIMETHOPRIM 400-80 MG/5ML IV SOLN
15.0000 mg/kg/d | Freq: Three times a day (TID) | INTRAVENOUS | Status: DC
  Administered 2018-03-16 – 2018-03-18 (×5): 415.04 mg via INTRAVENOUS
  Filled 2018-03-16 (×6): qty 25.94

## 2018-03-16 MED ORDER — SODIUM CHLORIDE 0.9 % IV BOLUS
1000.0000 mL | Freq: Once | INTRAVENOUS | Status: AC
  Administered 2018-03-16: 1000 mL via INTRAVENOUS

## 2018-03-16 MED ORDER — METHYLPREDNISOLONE SODIUM SUCC 125 MG IJ SOLR
60.0000 mg | Freq: Once | INTRAMUSCULAR | Status: AC
  Administered 2018-03-16: 60 mg via INTRAVENOUS
  Filled 2018-03-16: qty 2

## 2018-03-16 MED ORDER — ACETAMINOPHEN 325 MG PO TABS
650.0000 mg | ORAL_TABLET | Freq: Once | ORAL | Status: AC
  Administered 2018-03-16: 650 mg via ORAL
  Filled 2018-03-16: qty 2

## 2018-03-16 MED ORDER — LACTATED RINGERS IV SOLN
INTRAVENOUS | Status: AC
  Administered 2018-03-16: 14:00:00 via INTRAVENOUS

## 2018-03-16 MED ORDER — SULFAMETHOXAZOLE-TRIMETHOPRIM 800-160 MG PO TABS
2.0000 | ORAL_TABLET | Freq: Three times a day (TID) | ORAL | Status: DC
  Administered 2018-03-16: 2 via ORAL
  Filled 2018-03-16: qty 2

## 2018-03-16 MED ORDER — ENOXAPARIN SODIUM 40 MG/0.4ML ~~LOC~~ SOLN
40.0000 mg | SUBCUTANEOUS | Status: DC
  Filled 2018-03-16 (×2): qty 0.4

## 2018-03-16 MED ORDER — INSULIN ASPART 100 UNIT/ML ~~LOC~~ SOLN: 0.0000 [IU] | Freq: Three times a day (TID) | SUBCUTANEOUS | Status: DC

## 2018-03-16 MED ORDER — ALBUTEROL SULFATE (2.5 MG/3ML) 0.083% IN NEBU
2.5000 mg | INHALATION_SOLUTION | Freq: Once | RESPIRATORY_TRACT | Status: AC
  Administered 2018-03-16: 2.5 mg via RESPIRATORY_TRACT
  Filled 2018-03-16: qty 3

## 2018-03-16 MED ORDER — SODIUM CHLORIDE 0.9 % IV SOLN
1.0000 g | Freq: Once | INTRAVENOUS | Status: AC
  Administered 2018-03-16: 1 g via INTRAVENOUS
  Filled 2018-03-16: qty 10

## 2018-03-16 MED ORDER — INSULIN ASPART 100 UNIT/ML ~~LOC~~ SOLN: 0.0000 [IU] | Freq: Every day | SUBCUTANEOUS | Status: DC

## 2018-03-16 MED ORDER — ACETAMINOPHEN 650 MG RE SUPP: 650.0000 mg | Freq: Four times a day (QID) | RECTAL | Status: DC | PRN

## 2018-03-16 MED ORDER — SENNOSIDES-DOCUSATE SODIUM 8.6-50 MG PO TABS: 1.0000 | ORAL_TABLET | Freq: Every evening | ORAL | Status: DC | PRN

## 2018-03-16 MED ORDER — IPRATROPIUM-ALBUTEROL 0.5-2.5 (3) MG/3ML IN SOLN
3.0000 mL | Freq: Four times a day (QID) | RESPIRATORY_TRACT | Status: DC | PRN
  Administered 2018-03-17 – 2018-03-18 (×2): 3 mL via RESPIRATORY_TRACT
  Filled 2018-03-16 (×2): qty 3

## 2018-03-16 MED ORDER — ACETAMINOPHEN 325 MG PO TABS: 650.0000 mg | ORAL_TABLET | Freq: Four times a day (QID) | ORAL | Status: DC | PRN

## 2018-03-16 MED ORDER — SODIUM CHLORIDE 0.9 % IV SOLN
500.0000 mg | Freq: Once | INTRAVENOUS | Status: AC
  Administered 2018-03-16: 500 mg via INTRAVENOUS
  Filled 2018-03-16: qty 500

## 2018-03-16 NOTE — ED Triage Notes (Signed)
Pt. Stated, Lavenia Atlasve been SOB and feel like my skin is crawling for 2 weeks.

## 2018-03-16 NOTE — ED Notes (Signed)
BP cuff readjusted

## 2018-03-16 NOTE — ED Provider Notes (Signed)
[facility] EMERGENCY DEPARTMENT Provider Note   CSN: [id number] Arrival date & time: 03/16/18  4098     History   Chief Complaint Chief Complaint  Patient presents with  . Shortness of Breath  . Cough    HPI Dustin [name] is a 22 y.o. adult with history of untreated HIV presenting today for 2-week history of URI with cough.  Patient describes her cough as a constant hacking cough productive with yellow sputum that has been worsening over the past 2 weeks.  Patient states that she took 1 dose of Zyrtec last night without relief of symptoms.  Patient states that her cough has been causing her to feel short of breath over the past 1 week.  Patient denies history of fevers, nausea/vomiting, chest pain, abdominal pain, extremity swelling/pain.  Patient does endorse a skin pain as well however is unable to describe the pain.  Patient states that skin pain is primarily in the feet.  Patient denies trauma, swelling or color change of the feet.  Patient denies daily medication usage.  Denies taking HIV medication.  Denies hormone use in the last 6 months.  HPI  Past Medical History:  Diagnosis Date  . Acute appendicitis without mention of peritonitis 12/23/2012  . Allergy   . Furuncle 04/05/2017  . History of nephrolithiasis 12/23/2012   He had some hematuria on admit, asymptomatic  . HIV (human immunodeficiency virus infection) (HCC)   . Kidney stones   . Syphilis (acquired)     Patient Active Problem List   Diagnosis Date Noted  . HIV disease (HCC)   . [name]   . Lymphadenopathy   . Rash   . Acute appendicitis without mention of peritonitis 12/23/2012  . History of nephrolithiasis 12/23/2012  . History of elevated blood pressure while in hospital 07/22/2011  . Exposure to communicable disease 07/21/2011  . Tonsillitis 08/30/2007  . ATOPIC DERMATITIS 08/15/2007  . RHINITIS, ALLERGIC 06/17/2006    Past Surgical History:    Procedure Laterality Date  . LAPAROSCOPIC APPENDECTOMY N/A 12/22/2012   Procedure: APPENDECTOMY LAPAROSCOPIC;  Surgeon: Adolph Pollack, MD;  Location: [facility];  Service: General;  Laterality: N/A;        Home Medications    Prior to Admission medications   Medication Sig Start Date End Date Taking? Authorizing Provider  acetaminophen (TYLENOL) 500 MG tablet Take 1 tablet (500 mg total) by mouth every 6 (six) hours as needed. 04/05/17   Law, Waylan Boga, PA-C  bictegravir-emtricitabine-tenofovir AF (BIKTARVY) 50-200-25 MG TABS tablet Take 1 tablet by mouth daily. Try to take at the same time each day with or without food. 04/07/17   Blanchard Kelch, NP  cetirizine (ZYRTEC) 10 MG tablet Take 10 mg by mouth as needed for allergies or rhinitis.    [provider]  clindamycin (CLEOCIN) 150 MG capsule Take 3 capsules (450 mg total) by mouth 3 (three) times daily. 04/05/17   Law, Waylan Boga, PA-C  doxycycline (VIBRAMYCIN) 100 MG capsule Take 1 capsule (100 mg total) by mouth 2 (two) times daily. One po bid x 7 days Patient not taking: Reported on 04/23/2016 11/24/15   Trixie Dredge, PA-C  ibuprofen (ADVIL,MOTRIN) 600 MG tablet Take 1 tablet (600 mg total) by mouth every 6 (six) hours as needed. Patient not taking: Reported on 04/07/2017 04/05/17   Emi Holes, PA-C  OVER THE COUNTER MEDICATION Take 2-4 capsules by mouth as needed (Cough and Cold). Cough and Cold Gel  Capsules    [provider]  OVER THE COUNTER MEDICATION Apply 1 application topically daily. Antibacterial Cream    [provider]    Family History Family History  Problem Relation Age of Onset  . Heart disease Mother   . Drug abuse Father   . Diabetes Father   . Hypertension Father   . Hyperlipidemia Father   . Kidney disease Paternal Grandmother   . Stroke Paternal Grandmother   . Diabetes Paternal Grandfather   . Hyperlipidemia Paternal Grandfather   . Hypertension Paternal Grandfather      Social History Social History   Tobacco Use  . Smoking status: Current Every Day Smoker    Packs/day: 1.00    Types: Cigarettes  . Smokeless tobacco: Never Used  Substance Use Topics  . Alcohol use: Yes    Alcohol/week: 2.0 standard drinks    Types: 2 Cans of beer per week  . Drug use: Yes    Types: Marijuana     Allergies   Patient has no known allergies.   Review of Systems Review of Systems  Constitutional: Negative.  Negative for chills and fever.  HENT: Positive for congestion and rhinorrhea. Negative for facial swelling, sore throat, trouble swallowing and voice change.   Eyes: Negative.  Negative for visual disturbance.  Respiratory: Positive for cough and shortness of breath.   Cardiovascular: Negative.  Negative for chest pain and leg swelling.  Gastrointestinal: Negative.  Negative for abdominal pain, blood in stool, diarrhea, nausea and vomiting.  Genitourinary: Negative.  Negative for dysuria and hematuria.  Musculoskeletal: Negative.  Negative for arthralgias and myalgias.  Skin: Negative.  Negative for rash.  Neurological: Negative.  Negative for dizziness, syncope, weakness, light-headedness and headaches.   Physical Exam Updated Vital Signs BP (!) 101/54   Pulse (!) 113   Temp 99.6 F (37.6 C) (Oral)   Resp 16   Ht 6\' 4"  (1.93 m)   SpO2 98%   BMI 27.63 kg/m   Physical Exam  Constitutional: She appears well-developed and well-nourished. She does not appear ill. No distress.  HENT:  Head: Normocephalic and atraumatic.  Right Ear: Hearing, tympanic membrane, external ear and ear canal normal.  Left Ear: Hearing, tympanic membrane, external ear and ear canal normal.  Nose: Mucosal edema and rhinorrhea present.  Mouth/Throat: Uvula is midline and oropharynx is clear and moist. No oropharyngeal exudate.  The patient has normal phonation and is in control of secretions. No stridor.  Midline uvula without edema. Soft palate rises symmetrically. No  tonsillar erythema, swelling or exudates. Tongue protrusion is normal, floor of mouth is soft. No trismus. No creptius on neck palpation. No gingival erythema or fluctuance noted. Mucus membranes moist.  Eyes: Pupils are equal, round, and reactive to light. Conjunctivae and EOM are normal.  Neck: Trachea normal, normal range of motion, full passive range of motion without pain and phonation normal. Neck supple. No tracheal deviation present.  Cardiovascular: Regular rhythm and normal heart sounds. Tachycardia present.  Pulses:      Dorsalis pedis pulses are 2+ on the right side, and 2+ on the left side.       Posterior tibial pulses are 2+ on the right side, and 2+ on the left side.  Pulmonary/Chest: Effort normal. No accessory muscle usage. No respiratory distress. She has no decreased breath sounds. She has rhonchi. She exhibits no tenderness, no crepitus and no deformity.  Abdominal: Soft. There is no tenderness. There is no rigidity, no rebound and  no guarding.  Musculoskeletal: Normal range of motion.       Right knee: Normal.       Left knee: Normal.       Right ankle: Normal.       Left ankle: Normal.       Right lower leg: Normal.       Left lower leg: Normal.       Right foot: Normal.       Left foot: Normal.  Feet:  Right Foot:  Protective Sensation: 3 sites tested. 3 sites sensed.  Left Foot:  Protective Sensation: 3 sites tested. 3 sites sensed.  Lymphadenopathy:    She has no cervical adenopathy.  Neurological: She is alert. GCS eye subscore is 4. GCS verbal subscore is 5. GCS motor subscore is 6.  Speech is clear and goal oriented, follows commands Major Cranial nerves without deficit, no facial droop Normal strength in upper and lower extremities bilaterally including dorsiflexion and plantar flexion, strong and equal grip strength Sensation normal to light touch Moves extremities without ataxia, coordination intact Normal gait  Skin: Skin is warm and dry.    Psychiatric: She has a normal mood and affect. Her behavior is normal.   ED Treatments / Results  Labs (all labs ordered are listed, but only abnormal results are displayed) Labs Reviewed  CBC WITH DIFFERENTIAL/PLATELET - Abnormal; Notable for the following components:      Result Value   Hemoglobin 9.2 (*)    HCT 31.7 (*)    MCV 72.4 (*)    MCH 21.0 (*)    MCHC 29.0 (*)    RDW 16.5 (*)    nRBC 0.4 (*)    All other components within normal limits  BASIC METABOLIC PANEL - Abnormal; Notable for the following components:   Sodium 131 (*)    CO2 19 (*)    Calcium 8.1 (*)    All other components within normal limits  GROUP A STREP BY PCR  INFLUENZA PANEL BY PCR (TYPE A & B)  T-HELPER CELLS (CD4) COUNT (NOT [facility])  HIV-1 RNA QUANT-NO REFLEX-BLD  I-STAT CG4 LACTIC ACID, ED  I-STAT CG4 LACTIC ACID, ED    EKG None  Radiology Dg Chest 2 View  Result Date: 03/16/2018 CLINICAL DATA:  Cough for 2 weeks with shortness of breath. History of HIV. EXAM: CHEST - 2 VIEW COMPARISON:  04/05/2017 FINDINGS: The cardiomediastinal silhouette is within normal limits. New fine interstitial opacity is present diffusely throughout both lungs, greatest in the mid lungs with associated peribronchial thickening. No pleural effusion or pneumothorax is identified. No acute osseous abnormality is seen. IMPRESSION: New interstitial opacity throughout both lungs concerning for infection (including atypical and opportunistic infection). Electronically Signed   By: Sebastian AcheAllen  Grady M.D.   On: 03/16/2018 09:24    Procedures Procedures (including critical care time)  Medications Ordered in ED Medications  sodium chloride 0.9 % bolus 1,000 mL (0 mLs Intravenous Stopped 03/16/18 1007)  acetaminophen (TYLENOL) tablet 650 mg (650 mg Oral Given 03/16/18 0934)  albuterol (PROVENTIL) (2.5 MG/3ML) 0.083% nebulizer solution 2.5 mg (2.5 mg Nebulization Given 03/16/18 0935)  cefTRIAXone (ROCEPHIN) 1 g in sodium chloride  0.9 % 100 mL IVPB (0 g Intravenous Stopped 03/16/18 1015)  azithromycin (ZITHROMAX) 500 mg in sodium chloride 0.9 % 250 mL IVPB (0 mg Intravenous Stopped 03/16/18 1130)     Initial Impression / Assessment and Plan / ED Course  I have reviewed the triage vital signs and the nursing  notes.  Pertinent labs & imaging results that were available during my care of the patient were reviewed by me and considered in my medical decision making (see chart for details).  Clinical Course as of Mar 17 1155  Wed Mar 16, 2018  1045 Patient informed of care plan and need for admission.  Patient is agreeable to plan of care at this time, resting comfortably no acute distress.   [BM]    Clinical Course User Index [BM] Bill Salinas, PA-C   22 year old patient presenting today for 2-week history of cough and shortness of breath.  Patient with uncontrolled HIV.  Initially tachycardic and hypoxic on arrival, chest x-ray shows bilateral pneumonia.  Patient given breathing treatment with improvement of hypoxia.  IV Rocephin and azithromycin given.  CBC and BMP unremarkable, influenza panel negative, lactic acid 0.87.  Fluid bolus given along with Tylenol.  Admission consult called to Dr. Mikey Bussing, patient accepted by teaching service for further antibiotic of community acquired pneumonia in setting of HIV.   Patient resting comfortably in emergency department, states improvement of symptoms following breathing treatment.  States understanding of care plan and is agreeable to admission.  Patient also seen and evaluated by Dr. Clayborne Dana who agrees with admission for continued antibiotic therapy at this time.   Note: Portions of this report may have been transcribed using voice recognition software. Every effort was made to ensure accuracy; however, inadvertent computerized transcription errors may still be present.  Final Clinical Impressions(s) / ED Diagnoses   Final diagnoses:  Pneumonia of both lungs due  to infectious organism, unspecified part of lung  HIV infection, unspecified symptom status [facility])    ED Discharge Orders    None       Elizabeth Palau 03/16/18 1300    Mesner, Barbara Cower, MD 03/16/18 1550

## 2018-03-16 NOTE — H&P (Addendum)
Date: 03/16/2018               Patient Name:  Dustin [name] MRN: [id number]  DOB: April 10, 1996 Age / Sex: 22 y.o., adult   PCP: Patient, No Pcp Per         Medical Service: Internal Medicine Teaching Service         Attending Physician: Dr. Levert Feinstein, MD    First Contact: Dr. Petra Kuba Pager: [phone]  Second Contact: Dr. Evelene Croon Pager: [phone]       After Hours (After 5p/  First Contact Pager: [phone]  weekends / holidays): Second Contact Pager: [phone]   Chief Complaint: Shortness of breath  History of Present Illness: Ms. Dustin [name] is a 22 year old transgender male with past medical history of HIV that presents to the emergency department for a 2 week history of progressive productive cough with white sputum. She has had increased shortness of breath for the past few days that prompted her to come to the ED. She reports chills today.  She denies nausea/vomiting, headaches, abdominal pain, dysuria or lower extremity swelling. She currently lives by herself in an apartment.  She denies being around any sick contacts.  She knows about her HIV status however has not started HIV medications due to having a hard time accepting her diagnosis.  Meds:  No outpatient medications have been marked as taking for the 03/16/18 encounter [facility] Encounter).     Allergies: Allergies as of 03/16/2018  . (No Known Allergies)   Past Medical History:  Diagnosis Date  . Acute appendicitis without mention of peritonitis 12/23/2012  . Allergy   . Furuncle 04/05/2017  . History of nephrolithiasis 12/23/2012   He had some hematuria on admit, asymptomatic  . HIV (human immunodeficiency virus infection) (HCC)   . Kidney stones   . Syphilis (acquired)     Family History:  Family History  Problem Relation Age of Onset  . Heart disease Mother   . Drug abuse Father   . Diabetes Father   . Hypertension Father   . Hyperlipidemia Father   . Kidney disease Paternal  Grandmother   . Stroke Paternal Grandmother   . Diabetes Paternal Grandfather   . Hyperlipidemia Paternal Grandfather   . Hypertension Paternal Grandfather     Social History: Tobacco use:  10 pack year smoking history Alcohol use: Denies Illicit drug use: Admits to marijuana use but denies any other illicit drug use  Review of Systems: A complete ROS was negative except as per HPI.   Physical Exam: Blood pressure 99/63, pulse (!) 103, temperature 99.6 F (37.6 C), temperature source Oral, resp. rate 20, height 6\' 4"  (1.93 m), SpO2 96 %. Physical Exam  Constitutional: She is oriented to person, place, and time. No distress.  Eyes: Pupils are equal, round, and reactive to light. Conjunctivae are normal.  Neck: Normal range of motion. Neck supple.  Cardiovascular: Normal rate, regular rhythm and normal heart sounds. Exam reveals no gallop and no friction rub.  No murmur heard. Pulmonary/Chest: Effort normal. No respiratory distress. She has no wheezes.  Minimal course breath sounds throughout  Abdominal: Soft. She exhibits no distension. There is no tenderness. There is no rebound and no guarding.  Musculoskeletal: She exhibits no edema.  Neurological: She is alert and oriented to person, place, and time.  Skin: Skin is warm and dry.    CXR: personally reviewed my interpretation is interstitial opacities throughout both lungs. No pleural effusion or interstitial  edema noted.   Assessment & Plan by Problem: Principal Problem:   Bilateral pneumonia Active Problems:   HIV disease [facility](HCC)   [name]  Ms. Dustin [name] is a 22 year old transgender male with past medical history of HIV, not on therapy, that presents with cough and shortness of breath concerning for pneumonia on imaging.  She is tachycardic, but other vitals stable.  Bilateral pneumonia Interstitial opacities noted throughout both lungs concerning for atypical and opportunistic infection.  Patient is not currently on HIV medications.  She has not been hypoxic but has required breathing treatments for her shortness of breath in the ED.  She was started on CAP overage in the ED with ceftriaxone and azythromycin. Will also add coverage for pneumocystis pneumonia. -Ceftriaxone and azithromycin -Bactrim -Legionella urine antigen -RVP -ID consulted -LDH -duoneb -pcp sputum  HIV Originally diagnosed with acute HIV in 11/2015. CD4 nadir 320.  Obtaining viral load and CD4 count.   -viral load -CD4 count -ID consult -Hepatitis panel -LFTs  Dispo: Admit patient to Inpatient with expected length of stay greater than 2 midnights.  Signed: Camelia PhenesHoffman, Dustin Probasco Ratliff, DO 03/16/2018, 12:48 PM  Pager: [phone]

## 2018-03-16 NOTE — Consult Note (Addendum)
[facility]  Total days of antibiotics 1 azithro/ceftriaxone               Reason for Consult: hiv disease, engagement into care    Referring Physician: granfortuna  Principal Problem:   Bilateral pneumonia Active Problems:   HIV disease [facility])   [name]    HPI: Dustin [name] is a 22 y.o. transgender male, dx with HIV in July 2017 but did not get into care until December 2018 in the setting of being hospitalized. She was seen once at our ID clinic in Dec 2018 where she was suppose to start on biktarvy but was not seen back into care since she relocated to [place]. Her cd 4 count of 320/VL 20,300 (in Dec 2018) at that time.  She now present with a 2 week history of cough with some productive yellow sputum, with some shortness of breath over the last week. She is not currently on ART, nor any gender affirming hormones. She is afebrile on admit, WBC of 5.2, she has rhonchi on exam, vitals did not show hypoxia but cxr does show bilaterally patchy interstitial pattern per my read. She is visiting her family in [place] and is planning to return back to [place]. She has not sought care for her hiv in [place].   Past Medical History:  Diagnosis Date  . Acute appendicitis without mention of peritonitis 12/23/2012  . Allergy   . Furuncle 04/05/2017  . History of nephrolithiasis 12/23/2012   He had some hematuria on admit, asymptomatic  . HIV (human immunodeficiency virus infection) (HCC)   . Kidney stones   . Syphilis (acquired)     Allergies: No Known Allergies  Current antibiotics:   MEDICATIONS: . enoxaparin (LOVENOX) injection  40 mg Subcutaneous Q24H  . sulfamethoxazole-trimethoprim  2 tablet Oral Q8H    Social History   Tobacco Use  . Smoking status: Current Every Day Smoker    Packs/day: 1.00    Types: Cigarettes  . Smokeless tobacco: Never Used  Substance Use Topics  . Alcohol use: Yes   Alcohol/week: 2.0 standard drinks    Types: 2 Cans of beer per week  . Drug use: Yes    Types: Marijuana    Family History  Problem Relation Age of Onset  . Heart disease Mother   . Drug abuse Father   . Diabetes Father   . Hypertension Father   . Hyperlipidemia Father   . Kidney disease Paternal Grandmother   . Stroke Paternal Grandmother   . Diabetes Paternal Grandfather   . Hyperlipidemia Paternal Grandfather   . Hypertension Paternal Grandfather      Review of Systems  Constitutional: positive for nightsweats,  fever, chills, diaphoresis, activity change, appetite change, fatigue and unexpected weight change.  HENT: Negative for congestion, sore throat, rhinorrhea, sneezing, trouble swallowing and sinus pressure.  Eyes: Negative for photophobia and visual disturbance.  Respiratory: positive for cough, and shortness of breath. But negative chest tightness,  wheezing and stridor.  Cardiovascular: Negative for chest pain, palpitations and leg swelling.  Gastrointestinal: Negative for nausea, vomiting, abdominal pain, diarrhea, constipation, blood in stool, abdominal distention and anal bleeding.  Genitourinary: Negative for dysuria, hematuria, flank pain and difficulty urinating.  Musculoskeletal: Negative for myalgias, back pain, joint swelling, arthralgias and gait problem.  Skin: Negative for color change, pallor, rash and wound.  Neurological: Negative for dizziness, tremors, weakness and light-headedness.  Hematological: Negative for adenopathy. Does not bruise/bleed  easily.  Psychiatric/Behavioral: Negative for behavioral problems, confusion, sleep disturbance, dysphoric mood, decreased concentration and agitation.     OBJECTIVE: Temp:  [99.6 F (37.6 C)-99.9 F (37.7 C)] 99.6 F (37.6 C) (11/27 1053) Pulse Rate:  [103-132] 113 (11/27 1215) Resp:  [16-20] 20 (11/27 1200) BP: (99-136)/(54-90) 111/74 (11/27 1215) SpO2:  [92 %-100 %] 95 % (11/27 1215) Physical Exam    Constitutional:  oriented to person, place, and time. appears well-developed and well-nourished. No distress.  HENT: [place]/AT, PERRLA, no scleral icterus Mouth/Throat: Oropharynx is clear and moist. No oropharyngeal exudate.  Cardiovascular: Normal rate, regular rhythm and normal heart sounds. Exam reveals no gallop and no friction rub.  No murmur heard.  Pulmonary/Chest: Effort normal and breath sounds normal. No respiratory distress. +rhonchi Neck = supple, no nuchal rigidity Abdominal: Soft. Bowel sounds are normal.  exhibits no distension. There is no tenderness.  Lymphadenopathy: no cervical adenopathy. No axillary adenopathy Neurological: alert and oriented to person, place, and time.  Skin: Skin is warm and dry. No rash noted. No erythema.  Psychiatric: a normal mood and affect.  behavior is normal.    LABS: Results for orders placed or performed during the hospital encounter of 03/16/18 (from the past 48 hour(s))  CBC with Differential     Status: Abnormal   Collection Time: 03/16/18  8:39 AM  Result Value Ref Range   WBC 5.2 4.0 - 10.5 K/uL   RBC 4.38 4.22 - 5.81 MIL/uL   Hemoglobin 9.2 (L) 13.0 - 17.0 g/dL   HCT 32.4 (L) 40.1 - 02.7 %   MCV 72.4 (L) 80.0 - 100.0 fL   MCH 21.0 (L) 26.0 - 34.0 pg   MCHC 29.0 (L) 30.0 - 36.0 g/dL   RDW 25.3 (H) 66.4 - 40.3 %   Platelets 280 150 - 400 K/uL   nRBC 0.4 (H) 0.0 - 0.2 %   Neutrophils Relative % 76 %   Neutro Abs 3.9 1.7 - 7.7 K/uL   Lymphocytes Relative 13 %   Lymphs Abs 0.7 0.7 - 4.0 K/uL   Monocytes Relative 9 %   Monocytes Absolute 0.5 0.1 - 1.0 K/uL   Eosinophils Relative 1 %   Eosinophils Absolute 0.1 0.0 - 0.5 K/uL   Basophils Relative 0 %   Basophils Absolute 0.0 0.0 - 0.1 K/uL   Immature Granulocytes 1 %   Abs Immature Granulocytes 0.04 0.00 - 0.07 K/uL    Comment: Performed at [facility], [address], [place], [place] [address]  Basic metabolic panel     Status: Abnormal   Collection Time: 03/16/18   8:39 AM  Result Value Ref Range   Sodium 131 (L) 135 - 145 mmol/L   Potassium 3.5 3.5 - 5.1 mmol/L   Chloride 101 98 - 111 mmol/L   CO2 19 (L) 22 - 32 mmol/L   Glucose, Bld 99 70 - 99 mg/dL   BUN 7 6 - 20 mg/dL   Creatinine, Ser 9.56 0.61 - 1.24 mg/dL   Calcium 8.1 (L) 8.9 - 10.3 mg/dL   GFR calc non Af Amer >60 >60 mL/min   GFR calc Af Amer >60 >60 mL/min   Anion gap 11 5 - 15    Comment: Performed at [facility], [address], [place], [place] [address]  Group A Strep by PCR     Status: None   Collection Time: 03/16/18  8:40 AM  Result Value Ref Range   Group A Strep by  PCR NOT DETECTED NOT DETECTED    Comment: Performed at [facility], [address] [place], [place] [address]  Influenza panel by PCR (type A & B)     Status: None   Collection Time: 03/16/18 10:07 AM  Result Value Ref Range   Influenza A By PCR NEGATIVE NEGATIVE   Influenza B By PCR NEGATIVE NEGATIVE    Comment: (NOTE) The Xpert Xpress Flu assay is intended as an aid in the diagnosis of  influenza and should not be used as a sole basis for treatment.  This  assay is FDA approved for nasopharyngeal swab specimens only. Nasal  washings and aspirates are unacceptable for Xpert Xpress Flu testing. Performed at [facility], [address] [place], [place] [address]   I-Stat CG4 Lactic Acid, ED     Status: None   Collection Time: 03/16/18 10:48 AM  Result Value Ref Range   Lactic Acid, Venous 0.87 0.5 - 1.9 mmol/L    MICRO: reviewed IMAGING: Dg Chest 2 View  Result Date: 03/16/2018 CLINICAL DATA:  Cough for 2 weeks with shortness of breath. History of HIV. EXAM: CHEST - 2 VIEW COMPARISON:  04/05/2017 FINDINGS: The cardiomediastinal silhouette is within normal limits. New fine interstitial opacity is present diffusely throughout both lungs, greatest in the mid lungs with associated peribronchial thickening. No pleural effusion or pneumothorax is identified. No acute osseous abnormality  is seen. IMPRESSION: New interstitial opacity throughout both lungs concerning for infection (including atypical and opportunistic infection). Electronically Signed   By: Sebastian AcheAllen  Grady M.D.   On: 03/16/2018 09:24    HISTORICAL MICRO/IMAGING  Assessment/Plan:  22yo TF with poorly controlled HIV disease admitted for atypical pneumonia concerning for pneumocystis pneumonia -please get PJP stain - would start bactrim IV and treat for presumed PCP, would also recommend to start steroids - will check cd 4 count (though will be lower in the setting of acute infection) and viral load  - in terms of ART - we will need to see what kind of access to medications that we can get her. Ideally would like to get her 2 months antiretrovirals and have her see someone on [place] ( [facility] [place] [facility] - will check [facility]  Dr Daiva EvesVan Dam to see over the next few days

## 2018-03-16 NOTE — Progress Notes (Signed)
Pt is not in room for breathing treatment currently.

## 2018-03-17 DIAGNOSIS — J189 Pneumonia, unspecified organism: Secondary | ICD-10-CM

## 2018-03-17 DIAGNOSIS — D509 Iron deficiency anemia, unspecified: Secondary | ICD-10-CM

## 2018-03-17 LAB — COMPREHENSIVE METABOLIC PANEL
ALK PHOS: 50 U/L (ref 38–126)
ALT: 23 U/L (ref 0–44)
AST: 20 U/L (ref 15–41)
Albumin: 2.2 g/dL — ABNORMAL LOW (ref 3.5–5.0)
Anion gap: 7 (ref 5–15)
BILIRUBIN TOTAL: 0.3 mg/dL (ref 0.3–1.2)
BUN: 10 mg/dL (ref 6–20)
CALCIUM: 8.4 mg/dL — AB (ref 8.9–10.3)
CO2: 22 mmol/L (ref 22–32)
CREATININE: 0.99 mg/dL (ref 0.61–1.24)
Chloride: 106 mmol/L (ref 98–111)
Glucose, Bld: 166 mg/dL — ABNORMAL HIGH (ref 70–99)
Potassium: 4.3 mmol/L (ref 3.5–5.1)
Sodium: 135 mmol/L (ref 135–145)
TOTAL PROTEIN: 6.8 g/dL (ref 6.5–8.1)

## 2018-03-17 LAB — IRON AND TIBC
Iron: 59 ug/dL (ref 45–182)
SATURATION RATIOS: 26 % (ref 17.9–39.5)
TIBC: 228 ug/dL — ABNORMAL LOW (ref 250–450)
UIBC: 169 ug/dL

## 2018-03-17 LAB — CBC
HCT: 30 % — ABNORMAL LOW (ref 39.0–52.0)
Hemoglobin: 8.6 g/dL — ABNORMAL LOW (ref 13.0–17.0)
MCH: 20.6 pg — ABNORMAL LOW (ref 26.0–34.0)
MCHC: 28.7 g/dL — ABNORMAL LOW (ref 30.0–36.0)
MCV: 71.9 fL — AB (ref 80.0–100.0)
NRBC: 0.6 % — AB (ref 0.0–0.2)
PLATELETS: 290 10*3/uL (ref 150–400)
RBC: 4.17 MIL/uL — ABNORMAL LOW (ref 4.22–5.81)
RDW: 17 % — AB (ref 11.5–15.5)
WBC: 3.5 10*3/uL — ABNORMAL LOW (ref 4.0–10.5)

## 2018-03-17 LAB — SAVE SMEAR(SSMR), FOR PROVIDER SLIDE REVIEW

## 2018-03-17 LAB — GLUCOSE, CAPILLARY
GLUCOSE-CAPILLARY: 141 mg/dL — AB (ref 70–99)
GLUCOSE-CAPILLARY: 164 mg/dL — AB (ref 70–99)

## 2018-03-17 LAB — FERRITIN: Ferritin: 1101 ng/mL — ABNORMAL HIGH (ref 24–336)

## 2018-03-17 MED ORDER — DARUN-COBIC-EMTRICIT-TENOFAF 800-150-200-10 MG PO TABS
1.0000 | ORAL_TABLET | Freq: Every day | ORAL | Status: DC
  Administered 2018-03-17 – 2018-03-18 (×2): 1 via ORAL
  Filled 2018-03-17 (×2): qty 1

## 2018-03-17 MED ORDER — PREDNISONE 20 MG PO TABS
40.0000 mg | ORAL_TABLET | Freq: Two times a day (BID) | ORAL | Status: DC
  Administered 2018-03-17 – 2018-03-18 (×4): 40 mg via ORAL
  Filled 2018-03-17 (×3): qty 2

## 2018-03-17 NOTE — Progress Notes (Signed)
   Subjective: No acute events overnight.  Reports her breathing is notably better this morning but she is endorsing sweats and chills.  Denies nausea, vomiting, diarrhea.  Objective:  Vital signs in last 24 hours: Vitals:   03/16/18 2109 03/17/18 0055 03/17/18 0348 03/17/18 0747  BP: 120/78 126/84 117/88 126/85  Pulse: (!) 109 93 93 90  Resp: 16 20 16 19   Temp: 98.6 F (37 C) 98.4 F (36.9 C) 98.8 F (37.1 C) 97.7 F (36.5 C)  TempSrc: Oral Oral Oral Oral  SpO2: 100% 100% 99% 99%  Weight:      Height:       General: Laying in bed, gown is wet with sweat, no acute distress Cardiac: Tachycardic, regular rhythm, no murmurs rubs or gallops. Pulmonary: Clear to auscultation bilaterally with good air movement throughout Abdomen: Nontender, soft  Assessment/Plan:  Principal Problem:   Bilateral pneumonia Active Problems:   HIV disease (HCC)   [name]  1.  Bilateral pneumonia:  She is oxygenating well on room air and endorses subjective improvement with her shortness of breath and cough.  LDH came back elevated at 311, this supports a diagnosis of PCP but does not rule out other possibilities. - Continue broad-spectrum treatment with ceftriaxone and azithromycin for CAP coverage    - Bactrim and steroids for pneumocystis pneumonia -CBGs every 8 hour while on oral steroids.  Can add sliding scale insulin if needed. -Follow-up Legionella testing -Still needs sputum collected for PCP testing  2.  HIV: CD4 count is 30, viral load pending.  LFTs are normal.  Has not been on antiretrovirals.  Infectious disease is working on figuring out and affordable antiretroviral regiment -Follow-up ID recs -Follow-up viral load -Follow-up hepatitis panel  3.  Microcytic anemia: Hemoglobin of 9.2, MCV 72 on admission compared with prior results a year ago which showed hemoglobin 12, MCV 73.  Decreased MCV suggest iron deficiency anemia. -Anemia panel  We appreciate  infectious diseases assistance in the care of this patient.  Dispo: Anticipated discharge in approximately 3 day([name]).   Dustin [name], Dustin S, MD 03/17/2018, 10:13 AM Pager: [phone]

## 2018-03-17 NOTE — Progress Notes (Signed)
Subjective: No new complaints   Antibiotics:  Anti-infectives (From admission, onward)   Start     Dose/Rate Route Frequency Ordered Stop   03/16/18 2200  sulfamethoxazole-trimethoprim (BACTRIM) 415.04 mg in dextrose 5 % 500 mL IVPB     15 mg/kg/day  83 kg 350.6 mL/hr over 90 Minutes Intravenous Every 8 hours 03/16/18 2050     03/16/18 1400  sulfamethoxazole-trimethoprim (BACTRIM DS,SEPTRA DS) 800-160 MG per tablet 2 tablet  Status:  Discontinued     2 tablet Oral Every 8 hours 03/16/18 1353 03/16/18 2050   03/16/18 0930  cefTRIAXone (ROCEPHIN) 1 g in sodium chloride 0.9 % 100 mL IVPB     1 g 200 mL/hr over 30 Minutes Intravenous  Once 03/16/18 0916 03/16/18 1015   03/16/18 0930  azithromycin (ZITHROMAX) 500 mg in sodium chloride 0.9 % 250 mL IVPB     500 mg 250 mL/hr over 60 Minutes Intravenous  Once 03/16/18 0916 03/16/18 1130      Medications: Scheduled Meds: . enoxaparin (LOVENOX) injection  40 mg Subcutaneous Q24H  . predniSONE  40 mg Oral BID WC   Continuous Infusions: . sulfamethoxazole-trimethoprim 415.04 mg (03/17/18 0629)   PRN Meds:.acetaminophen **OR** acetaminophen, ipratropium-albuterol, senna-docusate    Objective: Weight change:   Intake/Output Summary (Last 24 hours) at 03/17/2018 1050 Last data filed at 03/16/2018 1700 Gross per 24 hour  Intake 281.67 ml  Output -  Net 281.67 ml   Blood pressure 126/85, pulse 90, temperature 97.7 F (36.5 C), temperature source Oral, resp. rate 19, height 6\' 5"  (1.956 m), weight 83 kg, SpO2 99 %. Temp:  [97.7 F (36.5 C)-99.6 F (37.6 C)] 97.7 F (36.5 C) (11/28 0747) Pulse Rate:  [90-113] 90 (11/28 0747) Resp:  [16-26] 19 (11/28 0747) BP: (99-137)/(54-93) 126/85 (11/28 0747) SpO2:  [95 %-100 %] 99 % (11/28 0747) Weight:  [83 kg] 83 kg (11/27 1631)  Physical Exam: General: Alert and awake, oriented x3, not in any acute distress. HEENT: anicteric sclera, EOMI CVS regular rate, normal  Chest: ,  no wheezing, no respiratory distress Abdomen: soft non-distended,  Extremities: no edema or deformity noted bilaterally Skin: no rashes Neuro: nonfocal  CBC:    BMET Recent Labs    03/16/18 0839 03/17/18 0721  NA 131* 135  K 3.5 4.3  CL 101 106  CO2 19* 22  GLUCOSE 99 166*  BUN 7 10  CREATININE 1.10 0.99  CALCIUM 8.1* 8.4*     Liver Panel  Recent Labs    03/17/18 0721  PROT 6.8  ALBUMIN 2.2*  AST 20  ALT 23  ALKPHOS 50  BILITOT 0.3       Sedimentation Rate No results for input(s): ESRSEDRATE in the last 72 hours. C-Reactive Protein No results for input(s): CRP in the last 72 hours.  Micro Results: Recent Results (from the past 720 hour(s))  Group A Strep by PCR     Status: None   Collection Time: 03/16/18  8:40 AM  Result Value Ref Range Status   Group A Strep by PCR NOT DETECTED NOT DETECTED Final    Comment: Performed at [facility], [address] [place], [place] [address]  Respiratory Panel by PCR     Status: None   Collection Time: 03/16/18  1:24 PM  Result Value Ref Range Status   Adenovirus NOT DETECTED NOT DETECTED Final   Coronavirus 229E NOT DETECTED NOT DETECTED Final   Coronavirus HKU1 NOT DETECTED  NOT DETECTED Final   Coronavirus NL63 NOT DETECTED NOT DETECTED Final   Coronavirus OC43 NOT DETECTED NOT DETECTED Final   Metapneumovirus NOT DETECTED NOT DETECTED Final   Rhinovirus / Enterovirus NOT DETECTED NOT DETECTED Final   Influenza A NOT DETECTED NOT DETECTED Final   Influenza B NOT DETECTED NOT DETECTED Final   Parainfluenza Virus 1 NOT DETECTED NOT DETECTED Final   Parainfluenza Virus 2 NOT DETECTED NOT DETECTED Final   Parainfluenza Virus 3 NOT DETECTED NOT DETECTED Final   Parainfluenza Virus 4 NOT DETECTED NOT DETECTED Final   Respiratory Syncytial Virus NOT DETECTED NOT DETECTED Final   Bordetella pertussis NOT DETECTED NOT DETECTED Final   Chlamydophila pneumoniae NOT DETECTED NOT DETECTED Final   Mycoplasma  pneumoniae NOT DETECTED NOT DETECTED Final    Comment: Performed at [facility], [address] [place], [place] [address]    Studies/Results: Dg Chest 2 View  Result Date: 03/16/2018 CLINICAL DATA:  Cough for 2 weeks with shortness of breath. History of HIV. EXAM: CHEST - 2 VIEW COMPARISON:  04/05/2017 FINDINGS: The cardiomediastinal silhouette is within normal limits. New fine interstitial opacity is present diffusely throughout both lungs, greatest in the mid lungs with associated peribronchial thickening. No pleural effusion or pneumothorax is identified. No acute osseous abnormality is seen. IMPRESSION: New interstitial opacity throughout both lungs concerning for infection (including atypical and opportunistic infection). Electronically Signed   By: Sebastian Ache M.D.   On: 03/16/2018 09:24      Assessment/Plan:  INTERVAL HISTORY: Feels slightly better   Principal Problem:   Bilateral pneumonia Active Problems:   HIV disease [facility])   [name]    Dustin [name] is a 22 y.o. adult black transgender woman with HIV and AIDS likely to be pneumonia on therapy for this along with corticosteroids not yet on antivirals  1 PCP pneumonia continue Bactrim along with prednisone can be started on at least 40 twice daily taper  #2 HIV AIDS: We will start [facility] today and try to see if we have a bottle in our clinic and if we can get another month supply through patient assistance alternatively could try different single tablet regimens or different pharmaceutical assistance programs BIKTARVY is certainly an option from [place], Delstrigo from [employer] and [employer] from [employer] since this could effect chocie   LOS: 1 day   Acey Lav 03/17/2018, 10:51 AM

## 2018-03-18 LAB — BASIC METABOLIC PANEL
Anion gap: 7 (ref 5–15)
BUN: 12 mg/dL (ref 6–20)
CALCIUM: 8.7 mg/dL — AB (ref 8.9–10.3)
CO2: 19 mmol/L — ABNORMAL LOW (ref 22–32)
Chloride: 110 mmol/L (ref 98–111)
Creatinine, Ser: 1.05 mg/dL (ref 0.61–1.24)
GFR calc Af Amer: >60 mL/min (ref >60)
Glucose, Bld: 155 mg/dL — ABNORMAL HIGH (ref 70–99)
POTASSIUM: 4.2 mmol/L (ref 3.5–5.1)
SODIUM: 136 mmol/L (ref 135–145)

## 2018-03-18 LAB — HEPATITIS B SURFACE ANTIBODY, QUANTITATIVE: Hepatitis B-Post: 5.2 m[IU]/mL — ABNORMAL LOW (ref >9.9)

## 2018-03-18 LAB — GLUCOSE, CAPILLARY: GLUCOSE-CAPILLARY: 107 mg/dL — AB (ref 70–99)

## 2018-03-18 LAB — HEPATITIS B CORE ANTIBODY, TOTAL: Hep B Core Total Ab: NEGATIVE

## 2018-03-18 LAB — CBC
HCT: 28.9 % — ABNORMAL LOW (ref 39.0–52.0)
Hemoglobin: 8.6 g/dL — ABNORMAL LOW (ref 13.0–17.0)
MCH: 21 pg — ABNORMAL LOW (ref 26.0–34.0)
MCHC: 29.8 g/dL — ABNORMAL LOW (ref 30.0–36.0)
MCV: 70.7 fL — ABNORMAL LOW (ref 80.0–100.0)
NRBC: 0.3 % — AB (ref 0.0–0.2)
PLATELETS: 364 10*3/uL (ref 150–400)
RBC: 4.09 MIL/uL — AB (ref 4.22–5.81)
RDW: 17.1 % — ABNORMAL HIGH (ref 11.5–15.5)
WBC: 9.3 10*3/uL (ref 4.0–10.5)

## 2018-03-18 LAB — HEPATITIS PANEL, ACUTE
HEP B C IGM: NEGATIVE
HEP B S AG: NEGATIVE
Hep A IgM: NEGATIVE

## 2018-03-18 LAB — HIV-1 RNA QUANT-NO REFLEX-BLD
HIV 1 RNA QUANT: 540000 {copies}/mL
LOG10 HIV-1 RNA: 5.732 {Log_copies}/mL

## 2018-03-18 MED ORDER — SULFAMETHOXAZOLE-TRIMETHOPRIM 800-160 MG PO TABS: 1.0000 | ORAL_TABLET | Freq: Three times a day (TID) | ORAL | 0 refills | Status: DC

## 2018-03-18 MED ORDER — SULFAMETHOXAZOLE-TRIMETHOPRIM 800-160 MG PO TABS
2.0000 | ORAL_TABLET | Freq: Two times a day (BID) | ORAL | Status: DC
  Administered 2018-03-18: 2 via ORAL
  Filled 2018-03-18: qty 2

## 2018-03-18 MED ORDER — PREDNISONE 20 MG PO TABS: ORAL_TABLET | ORAL | 0 refills | Status: DC

## 2018-03-18 MED ORDER — BICTEGRAVIR-EMTRICITAB-TENOFOV 50-200-25 MG PO TABS: 1.0000 | ORAL_TABLET | Freq: Every day | ORAL | 0 refills | Status: DC

## 2018-03-18 MED ORDER — SODIUM CHLORIDE 0.9 % IV SOLN
1.0000 g | INTRAVENOUS | Status: DC
  Administered 2018-03-18: 1 g via INTRAVENOUS
  Filled 2018-03-18: qty 10

## 2018-03-18 MED ORDER — BICTEGRAVIR-EMTRICITAB-TENOFOV 50-200-25 MG PO TABS: 1.0000 | ORAL_TABLET | Freq: Every day | ORAL | Status: DC

## 2018-03-18 MED ORDER — AZITHROMYCIN 250 MG PO TABS
250.0000 mg | ORAL_TABLET | Freq: Every day | ORAL | Status: DC
  Administered 2018-03-18: 250 mg via ORAL
  Filled 2018-03-18: qty 1

## 2018-03-18 MED ORDER — SULFAMETHOXAZOLE-TRIMETHOPRIM 800-160 MG PO TABS: 2.0000 | ORAL_TABLET | Freq: Three times a day (TID) | ORAL | Status: DC

## 2018-03-18 MED ORDER — PREDNISONE 20 MG PO TABS: ORAL_TABLET | ORAL | 0 refills | Status: AC

## 2018-03-18 MED ORDER — BICTEGRAVIR-EMTRICITAB-TENOFOV 50-200-25 MG PO TABS
1.0000 | ORAL_TABLET | Freq: Every day | ORAL | Status: DC
  Filled 2018-03-18: qty 3

## 2018-03-18 NOTE — Progress Notes (Signed)
Paged for prescription script.

## 2018-03-18 NOTE — Progress Notes (Addendum)
   Subjective: No acute events overnight.  Breathing and cough continue to improve.  She does complain of difficulty sleeping here because a place on her bottom was bothering her.  Objective:  Vital signs in last 24 hours: Vitals:   03/17/18 1653 03/17/18 2225 03/17/18 2324 03/18/18 0300  BP: 113/68  (!) 141/83 126/82  Pulse: 87  100 100  Resp:   18 18  Temp: 97.7 F (36.5 C)  97.8 F (36.6 C) 97.6 F (36.4 C)  TempSrc: Oral  Oral Oral  SpO2:  99% 100% 100%  Weight:      Height:       General: Laying in bed, appears comfortable Pulmonary: Clear to auscultation bilaterally with good air movement throughout Rectal: External anus appears normal.  No skin findings or hemorrhoids noted.  Assessment/Plan:  Principal Problem:   Bilateral pneumonia Active Problems:   HIV disease (HCC)   [name]  1.  Bilateral pneumonia: Presumed PCP.  Clinically improving on current treatment.  Labs are stable and she remains afebrile, oxygenating well on room air.  She does note shortness of breath on ambulating to the bathroom.  Will ask nurse to ambulate patient and monitor O2 saturation.  Appears stable for discharge but getting her HIV medications is likely to hold up disp.  - discontinue CAP coverage azithro and ceftriaxone.  - Switch to po Bactrim, will need to stay on bactrim prophylaxis after discharge. - continue po prednisone 21 day taper, starting at 40mg  bid .  - Will consult social work to assist with patient'[name] ability to afford medications on discharge as she is uninsured.  2.  HIV: Appreciate IDs assistance.  They have started her on Symtuza and are working on an affordable outpatient regiment.  3.  External rectum/anal discomfort: Describes intermittent discomfort with occasional painful bowel movements with scant blood on the toilet paper.  States that this issue will flare occasionally and sometimes reports discharge from a spot around the anus.  External  rectal exam was unremarkable.  She reports anal HPV screening 7 months ago that was negative.  She has not been receiving anal penetration recently.  Denies constipation.  Pain does not seem severe enough anal fissure.  Most likely hemorrhoid.  Recommended over-the-counter treatments.  Dispo: Anticipated discharge in approximately 1-3 day([name]) pending HIV medication. Patient will need pharmacy assistance and ID may be able to get a free 1 month sample.   Dustin [name], Dustin [name] S, MD 03/18/2018, 8:51 AM Pager: [phone]

## 2018-03-18 NOTE — Social Work (Signed)
CSW acknowledging consult for access to medications at discharge.  For medication access please consult RN Case Management. Infectious Disease physicians possibly able to assist with additional medication needs.  CSW signing off. Please consult if any additional needs arise.  Octavio GravesIsabel Eathan Groman, MSW, [facility] [place] Clinical Social Work [phone]

## 2018-03-18 NOTE — Care Management (Signed)
ED CM contacted by Amy Pharmacist on 2W concerning assistance with medications including HIV medications. Patient is visiting from [place], hospitalized with Pneumonia. Patient is uninsured and plans to be in [place] for at least 2 weeks. CM met with patient and was informed that patient was unable to obtain co-pay care from [facility] for retroviral meds b/c office is closed due to the holiday.    CM contacted the [place] concerning patient who has agreed to  provide patient with 5 day supply of retrovirals  Patient will also need antibiotic on discharge as well. CM provided discount card for antibiotic totally $24 which patient states she can afford. Patient also provided with the information to enroll online for [place]  advancing access.savings card on Monday for retroviral medication, patient verbalized understanding. Updated Dr. Frederico Hamman and Steffanie Dunn RN on 2W. Patient instructed to contact [place] to establish follow up appointment. No further ED CM needs identified.

## 2018-03-18 NOTE — Discharge Summary (Signed)
Name: GEREMY RISTER MRN: [id number] DOB: June 03, 1995 22 y.o. PCP: Patient, No Pcp Per  Date of Admission: 03/16/2018  8:30 AM Date of Discharge: 03/18/2018 Attending Physician: No att. providers found  Discharge Diagnosis: 1. Pneumocystis Pneumonia 2. HIV, uncontrolled   Discharge Medications: Allergies as of 03/18/2018   No Known Allergies     Medication List    STOP taking these medications   acetaminophen 500 MG tablet Commonly known as:  TYLENOL   clindamycin 150 MG capsule Commonly known as:  CLEOCIN   doxycycline 100 MG capsule Commonly known as:  VIBRAMYCIN   ibuprofen 600 MG tablet Commonly known as:  ADVIL,MOTRIN     TAKE these medications   bictegravir-emtricitabine-tenofovir AF 50-200-25 MG Tabs tablet Commonly known as:  BIKTARVY Take 1 tablet by mouth daily. What changed:  additional instructions   predniSONE 20 MG tablet Commonly known as:  DELTASONE Take 2 tablets (40 mg total) by mouth 2 (two) times daily with a meal for 3 days, THEN 2 tablets (40 mg total) daily with breakfast for 5 days, THEN 1 tablet (20 mg total) daily with breakfast for 11 days. Start taking on:  03/18/2018   sulfamethoxazole-trimethoprim 800-160 MG tablet Commonly known as:  BACTRIM DS,SEPTRA DS Take 1 tablet by mouth 3 (three) times daily.       Disposition and follow-up:   Ms.Qamar L Comacho was discharged from [facility] in Stable condition.  At the hospital follow up visit please address:  1.  Make sure she's able to get/afford HIV meds. Discharged with Biktarvy, 21 day course of Bactrim and prednisone taper for treatment of pneumocystis pneumonia   2.  Labs / imaging needed at time of follow-up: repeat CD4 count and viral load after she's on ART therapy  3.  Pending labs/ test needing follow-up: none  Follow-up Appointments: Follow-up Information    [facility] Follow up.   Why:  Patient instructed to  contact [facility] Case Manager Monday 12/2 after 9am to schedule a follow up appointment.  Contact information: [address] [place] [address] [phone]          Hospital Course by problem list: 1.  22yo transsexual gender identity male diagnosed with HIV on August 2017, not on medication. She presented with 2 weeks of worsening cough productive of white sputum and dyspnea and diagnosed with Pneumocystis Pneumonia. CD4 count was 30. She was hemodynamically stable, afebrile, and did not require supplemental oxygen. She was intially treated with bactrim, IV hydrocortisone, azithromycin, and ceftriaxone. She improved overnight and CAP coverage was discontinued. She  was discharged with bactrim and prednisone to complete a 21 day course. Social work and pharmacy arranged for these medicines to be delivered to bedside as well as a 5 day supply of Biktarvy and gave patient instructions to call [employer] and apply for their advancing access savings card on Monday when they open to obtain a longer supply of Biktarvy.   Discharge Vitals:   BP 133/79 (BP Location: Right Arm)   Pulse 95   Temp 97.6 F (36.4 C) (Oral)   Resp 18   Ht 6\' 5"  (1.956 m)   Wt 83 kg   SpO2 100%   BMI 21.70 kg/m   Pertinent Labs, Studies, and Procedures:  CLINICAL DATA:  Cough for 2 weeks with shortness of breath. History of HIV.  EXAM: CHEST - 2 VIEW  COMPARISON:  04/05/2017  FINDINGS: The  cardiomediastinal silhouette is within normal limits. New fine interstitial opacity is present diffusely throughout both lungs, greatest in the mid lungs with associated peribronchial thickening. No pleural effusion or pneumothorax is identified. No acute osseous abnormality is seen.  IMPRESSION: New interstitial opacity throughout both lungs concerning for infection (including atypical and opportunistic infection).  Respiratory viral panel negative LDH  elevated 311  CBC Latest Ref Rng & Units 03/18/2018 03/17/2018 03/16/2018  WBC 4.0 - 10.5 K/uL 9.3 3.5(L) 5.2  Hemoglobin 13.0 - 17.0 g/dL 1.6(X8.6(L) 0.9(U8.6(L) 0.4(V9.2(L)  Hematocrit 39.0 - 52.0 % 28.9(L) 30.0(L) 31.7(L)  Platelets 150 - 400 K/uL 364 290 280   CMP Latest Ref Rng & Units 03/18/2018 03/17/2018 03/16/2018  Glucose 70 - 99 mg/dL 409(W155(H) 119(J166(H) 99  BUN 6 - 20 mg/dL 12 10 7   Creatinine 0.61 - 1.24 mg/dL 4.781.05 2.950.99 6.211.10  Sodium 135 - 145 mmol/L 136 135 131(L)  Potassium 3.5 - 5.1 mmol/L 4.2 4.3 3.5  Chloride 98 - 111 mmol/L 110 106 101  CO2 22 - 32 mmol/L 19(L) 22 19(L)  Calcium 8.9 - 10.3 mg/dL 3.0(Q8.7(L) 6.5(H8.4(L) 8.1(L)  Total Protein 6.5 - 8.1 g/dL - 6.8 -  Total Bilirubin 0.3 - 1.2 mg/dL - 0.3 -  Alkaline Phos 38 - 126 U/L - 50 -  AST 15 - 41 U/L - 20 -  ALT 0 - 44 U/L - 23 -     Discharge Instructions: Discharge Instructions    Diet - low sodium heart healthy   Complete by:  As directed    Discharge instructions   Complete by:  As directed    You were hospitalized for Pneumonia. Thank you for allowing us to be part of your care.  Please note these changes made to your medications:     Please continue to take Prednisone two 20 mg tablets two times per day in the morning and evening for the next two days. Starting on Monday, December 2nd, please take two 20mg  tablets once per day with breakfast for five days. Starting Saturday, December 7th, please take one 20 mg for breakfast for 11 days.   Please continue to take sulfamethoxazole-trimethroprim (BACTRIM) three times per day.  Please make sure to take Biktarvy once a day. We gave you a prescription for a 1 month supply starting on Monday.   Please make sure to establish with an infectious disease provider when you go back to [place].  This will be extremely important as you can continue to get sick if your infection is not appropriately treated.   Please call us if you have any questions or concerns.   Increase activity slowly    Complete by:  As directed       Signed: Ali LoweVogel, Undra Harriman S, MD 03/20/2018, 10:16 AM   Pager: [phone]

## 2018-03-19 LAB — HEPATITIS B SURFACE ANTIGEN: HEP B S AG: NEGATIVE

## 2018-03-20 LAB — RPR, QUANT+TP ABS (REFLEX)
Rapid Plasma Reagin, Quant: 1:32 {titer} — ABNORMAL HIGH
T Pallidum Abs: REACTIVE — AB

## 2018-03-20 LAB — RPR: RPR Ser Ql: REACTIVE — AB

## 2018-03-20 NOTE — Progress Notes (Signed)
We got the patient a 5 day supply of Biktarvy from the inpatient pharmacy before she was discharged and then gave her instructions on how to sign up for the advancing access savings card from [employer] and she was given a separate prescription for a one month supply of Biktarvy to get filled once she has access to the savings card. I spoke with Harlow MaresAmy Thompson from pharmacy on Friday as well as a case manager in the ED, Michel BickersWandalyn Rogers, who helped coordinate all this.

## 2018-03-21 ENCOUNTER — Telehealth: Payer: Self-pay | Admitting: Infectious Diseases

## 2018-03-21 NOTE — Telephone Encounter (Signed)
-----   Message from Randall Hissornelius N Van Dam, MD sent at 03/21/2018  1:42 PM EST ----- I thought so! Statistically she was and is likely to be difficult to ensure engagement in treatment. We are still learning how to do better. Thanks for all of your hard work! ----- Message ----- From: Ali LoweVogel, Marie S, MD Sent: 03/21/2018  12:01 PM EST To: Randall Hissornelius N Van Dam, MD, Blanchard KelchStephanie N Persis Graffius, NP  Ok. Thank you! Sounds like a good plan. I was going to ask you to help coordinate syphilis treatment as well but you're already on top of it! Thanks for your help.   And the patient was driving the discharge Friday. We tried to convince her to stay so you could get her a better supply of HIV meds but she declined.   ----- Message ----- From: Blanchard Kelchixon, Rini Moffit N, NP Sent: 03/21/2018   8:27 AM EST To: Randall Hissornelius N Van Dam, MD, Ali LoweMarie S Vogel, MD  Thank you Dr. Petra KubaVogel! Will see if we can get her in to see our pharmacy team this week and get treatment for syphilis (her RPR was 1:32) and with a provider again in 2 weeks to see how she is doing.    ----- Message ----- From: Ali LoweVogel, Marie S, MD Sent: 03/20/2018   2:29 PM EST To: Randall Hissornelius N Van Dam, MD, Blanchard KelchStephanie N Lyndee Herbst, NP  We got the patient a 5 day supply of Biktarvy from the inpatient pharmacy before she was discharged and then gave her instructions on how to sign up for the advancing access savings card from [employer] and she was given a separate prescription for a one month supply of Biktarvy to get filled once she has access to the savings card. I spoke with Harlow MaresAmy Thompson from pharmacy on Friday as well as a case manager in the ED, Michel BickersWandalyn Rogers, who helped coordinate all this.   ----- Message ----- From: Daiva EvesVan Dam, Lisette Grinderornelius N, MD Sent: 03/19/2018  12:36 PM EST To: Blanchard KelchStephanie N Dejan Angert, NP, Ali LoweMarie S Vogel, MD  Steph they DC her before I could get her SYMTUZA   Can we get her into clinic asap  Hilda LiasMarie just for future info NO ONE knows how to get these MEDS EXCEPT OUR ID  PHARMACISTS AND ID DOCS. THE CASE MANAGERS NO ZERO ABOUT IT SO I CAN GUARANTEE pt didn't get SYMTUZA. ALl HIV meds cost around $3k per month

## 2018-03-21 NOTE — Telephone Encounter (Signed)
Attempted to call patient with the following:   1- Scheduled hospital follow up appointment with Pharmacy team this week on Thursday 12/05 @ 3:15 pm   2- RN appointment for bicillin injection @ 3:00 pm 12/05  3- Appt with Judeth CornfieldStephanie, NP Th 12/19 @ 10:00 am   Left non-specific voicemail to patient requesting call back to inform of the above.   Rexene AlbertsStephanie Phinehas Grounds, MSN, NP-C [facility]  [email or web address] Pager: [phone] Office: [phone]

## 2018-03-21 NOTE — Telephone Encounter (Signed)
Thanks Steph!

## 2018-03-24 ENCOUNTER — Ambulatory Visit: Payer: Self-pay

## 2018-03-24 NOTE — Telephone Encounter (Signed)
Can you try to give Raquel a call again for me to inform of upcoming appointments please?  Thank you

## 2018-03-25 NOTE — Telephone Encounter (Signed)
Thank you - she has been very resistant to get in care in the past. I am hopeful she will come.Marland Kitchen..Marland Kitchen

## 2018-03-25 NOTE — Telephone Encounter (Signed)
Unable to leave message as patient's voicemail is full.

## 2018-03-29 NOTE — Telephone Encounter (Signed)
Thank you - [place] so I wonder if she was only here for a visit.Marland Kitchen..Marland Kitchen

## 2018-03-29 NOTE — Telephone Encounter (Signed)
Still unable to reach patient. Gave Mitch a head's up in case we need assistance helping bring Raquel into care.

## 2018-04-07 ENCOUNTER — Inpatient Hospital Stay: Payer: Self-pay | Admitting: Infectious Diseases

## 2018-04-07 ENCOUNTER — Telehealth: Payer: Self-pay | Admitting: *Deleted

## 2018-04-07 NOTE — Telephone Encounter (Signed)
Patient no showed appointment 04/07/18. Per stephanie referred to [facility] for follow up for +RPR.

## 2019-08-21 ENCOUNTER — Emergency Department (HOSPITAL_COMMUNITY): Payer: Medicaid - Out of State

## 2019-08-21 ENCOUNTER — Other Ambulatory Visit: Payer: Self-pay

## 2019-08-21 ENCOUNTER — Encounter (HOSPITAL_COMMUNITY): Payer: Self-pay | Admitting: Emergency Medicine

## 2019-08-21 ENCOUNTER — Inpatient Hospital Stay (HOSPITAL_COMMUNITY)
Admission: EM | Admit: 2019-08-21 | Discharge: 2019-08-23 | DRG: 975 | Disposition: A | Discharge status: Home / Self Care | Payer: Medicaid - Out of State | Attending: Internal Medicine | Admitting: Internal Medicine

## 2019-08-21 DIAGNOSIS — A419 Sepsis, unspecified organism: Secondary | ICD-10-CM | POA: Diagnosis present

## 2019-08-21 DIAGNOSIS — N179 Acute kidney failure, unspecified: Secondary | ICD-10-CM | POA: Diagnosis present

## 2019-08-21 DIAGNOSIS — Z823 Family history of stroke: Secondary | ICD-10-CM

## 2019-08-21 DIAGNOSIS — Z813 Family history of other psychoactive substance abuse and dependence: Secondary | ICD-10-CM | POA: Diagnosis not present

## 2019-08-21 DIAGNOSIS — F32A Depression, unspecified: Secondary | ICD-10-CM | POA: Diagnosis present

## 2019-08-21 DIAGNOSIS — Z79899 Other long term (current) drug therapy: Secondary | ICD-10-CM | POA: Diagnosis not present

## 2019-08-21 DIAGNOSIS — F649 Gender identity disorder, unspecified: Secondary | ICD-10-CM | POA: Diagnosis present

## 2019-08-21 DIAGNOSIS — F17210 Nicotine dependence, cigarettes, uncomplicated: Secondary | ICD-10-CM | POA: Diagnosis present

## 2019-08-21 DIAGNOSIS — Z87442 Personal history of urinary calculi: Secondary | ICD-10-CM | POA: Diagnosis not present

## 2019-08-21 DIAGNOSIS — F329 Major depressive disorder, single episode, unspecified: Secondary | ICD-10-CM | POA: Diagnosis present

## 2019-08-21 DIAGNOSIS — Z789 Other specified health status: Secondary | ICD-10-CM | POA: Diagnosis present

## 2019-08-21 DIAGNOSIS — B370 Candidal stomatitis: Secondary | ICD-10-CM | POA: Diagnosis present

## 2019-08-21 DIAGNOSIS — Z681 Body mass index (BMI) 19 or less, adult: Secondary | ICD-10-CM | POA: Diagnosis not present

## 2019-08-21 DIAGNOSIS — E559 Vitamin D deficiency, unspecified: Secondary | ICD-10-CM | POA: Diagnosis present

## 2019-08-21 DIAGNOSIS — R748 Abnormal levels of other serum enzymes: Secondary | ICD-10-CM | POA: Diagnosis present

## 2019-08-21 DIAGNOSIS — H538 Other visual disturbances: Secondary | ICD-10-CM | POA: Diagnosis present

## 2019-08-21 DIAGNOSIS — E440 Moderate protein-calorie malnutrition: Secondary | ICD-10-CM | POA: Diagnosis present

## 2019-08-21 DIAGNOSIS — R64 Cachexia: Secondary | ICD-10-CM | POA: Diagnosis present

## 2019-08-21 DIAGNOSIS — D509 Iron deficiency anemia, unspecified: Secondary | ICD-10-CM | POA: Diagnosis present

## 2019-08-21 DIAGNOSIS — Z833 Family history of diabetes mellitus: Secondary | ICD-10-CM | POA: Diagnosis not present

## 2019-08-21 DIAGNOSIS — W19XXXA Unspecified fall, initial encounter: Secondary | ICD-10-CM | POA: Diagnosis present

## 2019-08-21 DIAGNOSIS — Z83438 Family history of other disorder of lipoprotein metabolism and other lipidemia: Secondary | ICD-10-CM

## 2019-08-21 DIAGNOSIS — Z20822 Contact with and (suspected) exposure to covid-19: Secondary | ICD-10-CM | POA: Diagnosis present

## 2019-08-21 DIAGNOSIS — Z8249 Family history of ischemic heart disease and other diseases of the circulatory system: Secondary | ICD-10-CM

## 2019-08-21 DIAGNOSIS — N390 Urinary tract infection, site not specified: Secondary | ICD-10-CM | POA: Diagnosis present

## 2019-08-21 DIAGNOSIS — F640 Transsexualism: Secondary | ICD-10-CM | POA: Diagnosis not present

## 2019-08-21 DIAGNOSIS — Z792 Long term (current) use of antibiotics: Secondary | ICD-10-CM

## 2019-08-21 DIAGNOSIS — B9620 Unspecified Escherichia coli [E. coli] as the cause of diseases classified elsewhere: Secondary | ICD-10-CM | POA: Diagnosis present

## 2019-08-21 DIAGNOSIS — B20 Human immunodeficiency virus [HIV] disease: Secondary | ICD-10-CM | POA: Diagnosis present

## 2019-08-21 DIAGNOSIS — I10 Essential (primary) hypertension: Secondary | ICD-10-CM | POA: Diagnosis present

## 2019-08-21 DIAGNOSIS — N29 Other disorders of kidney and ureter in diseases classified elsewhere: Secondary | ICD-10-CM | POA: Diagnosis present

## 2019-08-21 DIAGNOSIS — R809 Proteinuria, unspecified: Secondary | ICD-10-CM

## 2019-08-21 DIAGNOSIS — R6520 Severe sepsis without septic shock: Secondary | ICD-10-CM | POA: Diagnosis not present

## 2019-08-21 DIAGNOSIS — A4151 Sepsis due to Escherichia coli [E. coli]: Secondary | ICD-10-CM | POA: Diagnosis not present

## 2019-08-21 DIAGNOSIS — A530 Latent syphilis, unspecified as early or late: Secondary | ICD-10-CM | POA: Diagnosis present

## 2019-08-21 LAB — CBC WITH DIFFERENTIAL/PLATELET
Abs Immature Granulocytes: 0.26 10*3/uL — ABNORMAL HIGH (ref 0.00–0.07)
Basophils Absolute: 0 10*3/uL (ref 0.0–0.1)
Basophils Relative: 0 %
Eosinophils Absolute: 0 10*3/uL (ref 0.0–0.5)
Eosinophils Relative: 0 %
HCT: 24 % — ABNORMAL LOW (ref 39.0–52.0)
Hemoglobin: 7.5 g/dL — ABNORMAL LOW (ref 13.0–17.0)
Immature Granulocytes: 2 %
Lymphocytes Relative: 3 %
Lymphs Abs: 0.3 10*3/uL — ABNORMAL LOW (ref 0.7–4.0)
MCH: 22.7 pg — ABNORMAL LOW (ref 26.0–34.0)
MCHC: 31.3 g/dL (ref 30.0–36.0)
MCV: 72.7 fL — ABNORMAL LOW (ref 80.0–100.0)
Monocytes Absolute: 0.9 10*3/uL (ref 0.1–1.0)
Monocytes Relative: 7 %
Neutro Abs: 11.5 10*3/uL — ABNORMAL HIGH (ref 1.7–7.7)
Neutrophils Relative %: 88 %
Platelets: 512 10*3/uL — ABNORMAL HIGH (ref 150–400)
RBC: 3.3 MIL/uL — ABNORMAL LOW (ref 4.22–5.81)
RDW: 18.6 % — ABNORMAL HIGH (ref 11.5–15.5)
WBC: 13 10*3/uL — ABNORMAL HIGH (ref 4.0–10.5)
nRBC: 1.1 % — ABNORMAL HIGH (ref 0.0–0.2)

## 2019-08-21 LAB — URINALYSIS, ROUTINE W REFLEX MICROSCOPIC
Bilirubin Urine: NEGATIVE
Glucose, UA: NEGATIVE mg/dL
Ketones, ur: NEGATIVE mg/dL
Nitrite: POSITIVE — AB
Protein, ur: 30 mg/dL — AB
Specific Gravity, Urine: 1.005 (ref 1.005–1.030)
WBC, UA: >50 WBC/hpf — ABNORMAL HIGH (ref 0–5)
pH: 6 (ref 5.0–8.0)

## 2019-08-21 LAB — BASIC METABOLIC PANEL
Anion gap: 9 (ref 5–15)
BUN: 19 mg/dL (ref 6–20)
CO2: 22 mmol/L (ref 22–32)
Calcium: 8.8 mg/dL — ABNORMAL LOW (ref 8.9–10.3)
Chloride: 104 mmol/L (ref 98–111)
Creatinine, Ser: 1.57 mg/dL — ABNORMAL HIGH (ref 0.61–1.24)
GFR calc Af Amer: >60 mL/min (ref >60)
GFR calc non Af Amer: >60 mL/min (ref >60)
Glucose, Bld: 100 mg/dL — ABNORMAL HIGH (ref 70–99)
Potassium: 3.4 mmol/L — ABNORMAL LOW (ref 3.5–5.1)
Sodium: 135 mmol/L (ref 135–145)

## 2019-08-21 LAB — COMPREHENSIVE METABOLIC PANEL
ALT: 30 U/L (ref 0–44)
AST: 24 U/L (ref 15–41)
Albumin: 2.4 g/dL — ABNORMAL LOW (ref 3.5–5.0)
Alkaline Phosphatase: 150 U/L — ABNORMAL HIGH (ref 38–126)
Anion gap: 8 (ref 5–15)
BUN: 18 mg/dL (ref 6–20)
CO2: 24 mmol/L (ref 22–32)
Calcium: 8.7 mg/dL — ABNORMAL LOW (ref 8.9–10.3)
Chloride: 99 mmol/L (ref 98–111)
Creatinine, Ser: 1.72 mg/dL — ABNORMAL HIGH (ref 0.61–1.24)
GFR calc Af Amer: >60 mL/min (ref >60)
GFR calc non Af Amer: 54 mL/min — ABNORMAL LOW (ref >60)
Glucose, Bld: 114 mg/dL — ABNORMAL HIGH (ref 70–99)
Potassium: 3.5 mmol/L (ref 3.5–5.1)
Sodium: 131 mmol/L — ABNORMAL LOW (ref 135–145)
Total Bilirubin: 0.4 mg/dL (ref 0.3–1.2)
Total Protein: 6.7 g/dL (ref 6.5–8.1)

## 2019-08-21 LAB — HEMOGLOBIN A1C
Hgb A1c MFr Bld: 6.1 % — ABNORMAL HIGH (ref 4.8–5.6)
Mean Plasma Glucose: 128.37 mg/dL

## 2019-08-21 LAB — RESPIRATORY PANEL BY RT PCR (FLU A&B, COVID)
Influenza A by PCR: NEGATIVE
Influenza B by PCR: NEGATIVE
SARS Coronavirus 2 by RT PCR: NEGATIVE

## 2019-08-21 LAB — PROTEIN / CREATININE RATIO, URINE
Creatinine, Urine: 59.73 mg/dL
Protein Creatinine Ratio: 0.77 mg/mg{Cre} — ABNORMAL HIGH (ref 0.00–0.15)
Total Protein, Urine: 46 mg/dL

## 2019-08-21 LAB — VITAMIN D 25 HYDROXY (VIT D DEFICIENCY, FRACTURES): Vit D, 25-Hydroxy: 7.58 ng/mL — ABNORMAL LOW (ref 30–100)

## 2019-08-21 LAB — LACTIC ACID, PLASMA: Lactic Acid, Venous: 1.5 mmol/L (ref 0.5–1.9)

## 2019-08-21 LAB — PROTIME-INR
INR: 1.1 (ref 0.8–1.2)
Prothrombin Time: 13.6 seconds (ref 11.4–15.2)

## 2019-08-21 LAB — APTT: aPTT: 40 seconds — ABNORMAL HIGH (ref 24–36)

## 2019-08-21 LAB — CBG MONITORING, ED: Glucose-Capillary: 99 mg/dL (ref 70–99)

## 2019-08-21 LAB — TSH: TSH: 3.85 u[IU]/mL (ref 0.350–4.500)

## 2019-08-21 LAB — GAMMA GT: GGT: 102 U/L — ABNORMAL HIGH (ref 7–50)

## 2019-08-21 MED ORDER — METRONIDAZOLE IN NACL 5-0.79 MG/ML-% IV SOLN
500.0000 mg | Freq: Once | INTRAVENOUS | Status: AC
  Administered 2019-08-21: 500 mg via INTRAVENOUS
  Filled 2019-08-21: qty 100

## 2019-08-21 MED ORDER — FLUCONAZOLE 100 MG PO TABS
100.0000 mg | ORAL_TABLET | Freq: Every day | ORAL | Status: DC
  Administered 2019-08-21 – 2019-08-23 (×3): 100 mg via ORAL
  Filled 2019-08-21 (×3): qty 1

## 2019-08-21 MED ORDER — ACETAMINOPHEN 650 MG RE SUPP: 650.0000 mg | Freq: Four times a day (QID) | RECTAL | Status: DC | PRN

## 2019-08-21 MED ORDER — SULFAMETHOXAZOLE-TRIMETHOPRIM 800-160 MG PO TABS: 1.0000 | ORAL_TABLET | Freq: Every day | ORAL | Status: DC

## 2019-08-21 MED ORDER — VANCOMYCIN HCL 750 MG/150ML IV SOLN
750.0000 mg | Freq: Two times a day (BID) | INTRAVENOUS | Status: DC
  Administered 2019-08-21 – 2019-08-23 (×4): 750 mg via INTRAVENOUS
  Filled 2019-08-21 (×4): qty 150

## 2019-08-21 MED ORDER — LACTATED RINGERS IV BOLUS
1000.0000 mL | Freq: Once | INTRAVENOUS | Status: AC
  Administered 2019-08-21: 12:00:00 1000 mL via INTRAVENOUS

## 2019-08-21 MED ORDER — ONDANSETRON HCL 4 MG PO TABS: 4.0000 mg | ORAL_TABLET | Freq: Four times a day (QID) | ORAL | Status: DC | PRN

## 2019-08-21 MED ORDER — ACETAMINOPHEN 500 MG PO TABS
1000.0000 mg | ORAL_TABLET | Freq: Once | ORAL | Status: AC
  Administered 2019-08-21: 1000 mg via ORAL
  Filled 2019-08-21: qty 2

## 2019-08-21 MED ORDER — LACTATED RINGERS IV SOLN: INTRAVENOUS | Status: AC

## 2019-08-21 MED ORDER — SODIUM CHLORIDE 0.9 % IV SOLN
2.0000 g | Freq: Two times a day (BID) | INTRAVENOUS | Status: DC
  Administered 2019-08-21 – 2019-08-23 (×4): 2 g via INTRAVENOUS
  Filled 2019-08-21 (×5): qty 2

## 2019-08-21 MED ORDER — VANCOMYCIN HCL 1500 MG/300ML IV SOLN
1500.0000 mg | Freq: Once | INTRAVENOUS | Status: AC
  Administered 2019-08-21: 13:00:00 1500 mg via INTRAVENOUS
  Filled 2019-08-21 (×2): qty 300

## 2019-08-21 MED ORDER — SODIUM CHLORIDE 0.9 % IV SOLN
1000.0000 mL | INTRAVENOUS | Status: DC
  Administered 2019-08-21: 1000 mL via INTRAVENOUS

## 2019-08-21 MED ORDER — VANCOMYCIN HCL IN DEXTROSE 1-5 GM/200ML-% IV SOLN: 1000.0000 mg | Freq: Once | INTRAVENOUS | Status: DC

## 2019-08-21 MED ORDER — BICTEGRAVIR-EMTRICITAB-TENOFOV 50-200-25 MG PO TABS
1.0000 | ORAL_TABLET | Freq: Every day | ORAL | Status: DC
  Administered 2019-08-21 – 2019-08-23 (×3): 1 via ORAL
  Filled 2019-08-21 (×3): qty 1

## 2019-08-21 MED ORDER — POLYETHYLENE GLYCOL 3350 17 G PO PACK: 17.0000 g | PACK | Freq: Every day | ORAL | Status: DC | PRN

## 2019-08-21 MED ORDER — SODIUM CHLORIDE 0.9 % IV SOLN
2.0000 g | Freq: Once | INTRAVENOUS | Status: AC
  Administered 2019-08-21: 09:00:00 2 g via INTRAVENOUS
  Filled 2019-08-21: qty 2

## 2019-08-21 MED ORDER — ONDANSETRON HCL 4 MG/2ML IJ SOLN: 4.0000 mg | Freq: Four times a day (QID) | INTRAMUSCULAR | Status: DC | PRN

## 2019-08-21 MED ORDER — ACETAMINOPHEN 325 MG PO TABS: 650.0000 mg | ORAL_TABLET | Freq: Four times a day (QID) | ORAL | Status: DC | PRN

## 2019-08-21 MED ORDER — NYSTATIN 100000 UNIT/ML MT SUSP
5.0000 mL | Freq: Four times a day (QID) | OROMUCOSAL | Status: DC
  Administered 2019-08-21 – 2019-08-22 (×3): 500000 [IU] via ORAL
  Filled 2019-08-21 (×6): qty 5

## 2019-08-21 MED ORDER — ENOXAPARIN SODIUM 40 MG/0.4ML ~~LOC~~ SOLN
40.0000 mg | SUBCUTANEOUS | Status: DC
  Administered 2019-08-21: 40 mg via SUBCUTANEOUS
  Filled 2019-08-21: qty 0.4

## 2019-08-21 MED ORDER — DAPSONE 100 MG PO TABS
100.0000 mg | ORAL_TABLET | Freq: Every day | ORAL | Status: DC
  Administered 2019-08-21 – 2019-08-23 (×3): 100 mg via ORAL
  Filled 2019-08-21 (×3): qty 1

## 2019-08-21 NOTE — ED Provider Notes (Signed)
[facility] EMERGENCY DEPARTMENT Provider Note   CSN: [id number] Arrival date & time: 08/21/19  0100     History Chief Complaint  Patient presents with  . Fall    Dustin [name] is a 24 y.o. adult presenting for evaluation of weakness.  Patient states that the past week, she has been getting gradually weaker.  She states she is no longer able to walk 5 steps, as this will cause her legs to give out and fall.  She reports worsening shortness of breath, and a consistent cough productive of green mucus.  She denies fevers, chills, chest pain, nausea, vomiting, abdominal pain.  She reports urinary frequency and extreme thirst.  She denies a history of diabetes.  No dysuria or hematuria.  Patient states she has not had a bowel movement in 5 days, until tonight when she was unable to make it to the bathroom and had a single bowel movement in the waiting room.  She reports she was recently admitted to the hospital at [facility] approximately a month ago.  She has not followed up since then.  She has a history of HIV, is not on antivirals.  Recent history of pneumonia, has not followed up for repeat chest x-ray.  Additional history obtained from chart review.  Patient with a history of uncontrolled HIV, male to male transgender (not on hormones), lymphadenopathy, recent syphilis infection.   HPI     Past Medical History:  Diagnosis Date  . Acute appendicitis without mention of peritonitis 12/23/2012  . Allergy   . Furuncle 04/05/2017  . History of nephrolithiasis 12/23/2012   He had some hematuria on admit, asymptomatic  . HIV (human immunodeficiency virus infection) (HCC)   . Kidney stones   . Syphilis (acquired)     Patient Active Problem List   Diagnosis Date Noted  . Bilateral pneumonia 03/16/2018  . HIV disease (HCC)   . [name]   . Lymphadenopathy   . Rash   . Acute appendicitis without mention of peritonitis 12/23/2012  .  History of nephrolithiasis 12/23/2012  . History of elevated blood pressure while in hospital 07/22/2011  . Exposure to communicable disease 07/21/2011  . Tonsillitis 08/30/2007  . ATOPIC DERMATITIS 08/15/2007  . RHINITIS, ALLERGIC 06/17/2006    Past Surgical History:  Procedure Laterality Date  . LAPAROSCOPIC APPENDECTOMY N/A 12/22/2012   Procedure: APPENDECTOMY LAPAROSCOPIC;  Surgeon: Adolph Pollack, MD;  Location: [facility] ORS;  Service: General;  Laterality: N/A;       Family History  Problem Relation Age of Onset  . Heart disease Mother   . Drug abuse Father   . Diabetes Father   . Hypertension Father   . Hyperlipidemia Father   . Kidney disease Paternal Grandmother   . Stroke Paternal Grandmother   . Diabetes Paternal Grandfather   . Hyperlipidemia Paternal Grandfather   . Hypertension Paternal Grandfather     Social History   Tobacco Use  . Smoking status: Current Every Day Smoker    Packs/day: 1.00    Types: Cigarettes  . Smokeless tobacco: Never Used  Substance Use Topics  . Alcohol use: Yes    Alcohol/week: 2.0 standard drinks    Types: 2 Cans of beer per week  . Drug use: Yes    Types: Marijuana    Home Medications Prior to Admission medications   Medication Sig Start Date End Date Taking? Authorizing Provider  ferrous sulfate 325 (65 FE) MG tablet Take 325 mg by  mouth daily with breakfast. 05/10/19  Yes [provider]  lisinopril (ZESTRIL) 20 MG tablet Take 20 mg by mouth daily. 05/10/19  Yes [provider]  bictegravir-emtricitabine-tenofovir AF (BIKTARVY) 50-200-25 MG TABS tablet Take 1 tablet by mouth daily. 03/19/18   Rehman, Areeg N, DO  sulfamethoxazole-trimethoprim (BACTRIM DS,SEPTRA DS) 800-160 MG tablet Take 1 tablet by mouth 3 (three) times daily. 03/18/18   Rehman, Areeg N, DO    Allergies    Patient has no known allergies.  Review of Systems   Review of Systems  Respiratory: Positive for cough and shortness of breath.     Endocrine: Positive for polydipsia.  Genitourinary: Positive for frequency.  Neurological: Positive for weakness.  All other systems reviewed and are negative.   Physical Exam Updated Vital Signs BP (!) 155/104 (BP Location: Right Arm)   Pulse (!) 111   Temp (!) 102.2 F (39 C) (Oral)   Resp 18   Ht 6\' 5"  (1.956 m)   Wt 75 kg   SpO2 100%   BMI 19.61 kg/m   Physical Exam Vitals and nursing note reviewed.  Constitutional:      Appearance: She is well-developed. She is ill-appearing.     Comments: Appears chronically ill, and acutely ill-appearing  HENT:     Head: Normocephalic and atraumatic.     Comments: Thrush present Eyes:     Extraocular Movements: Extraocular movements intact.     Conjunctiva/sclera: Conjunctivae normal.     Pupils: Pupils are equal, round, and reactive to light.  Cardiovascular:     Rate and Rhythm: Regular rhythm. Tachycardia present.     Pulses: Normal pulses.     Comments: Tachycardic around 110 Pulmonary:     Effort: Pulmonary effort is normal. No respiratory distress.     Breath sounds: Normal breath sounds. No wheezing.     Comments: Clear lung sounds Abdominal:     General: There is no distension.     Palpations: Abdomen is soft. There is no mass.     Tenderness: There is no abdominal tenderness. There is no guarding or rebound.  Musculoskeletal:        General: Normal range of motion.     Cervical back: Normal range of motion and neck supple.     Right lower leg: No edema.     Left lower leg: No edema.     Comments: No leg pain or swelling  Skin:    General: Skin is warm and dry.     Capillary Refill: Capillary refill takes less than 2 seconds.  Neurological:     Mental Status: She is alert and oriented to person, place, and time.     ED Results / Procedures / Treatments   Labs (all labs ordered are listed, but only abnormal results are displayed) Labs Reviewed  CBC WITH DIFFERENTIAL/PLATELET - Abnormal; Notable for the  following components:      Result Value   WBC 13.0 (*)    RBC 3.30 (*)    Hemoglobin 7.5 (*)    HCT 24.0 (*)    MCV 72.7 (*)    MCH 22.7 (*)    RDW 18.6 (*)    Platelets 512 (*)    nRBC 1.1 (*)    Neutro Abs 11.5 (*)    Lymphs Abs 0.3 (*)    Abs Immature Granulocytes 0.26 (*)    All other components within normal limits  COMPREHENSIVE METABOLIC PANEL - Abnormal; Notable for the following components:  Sodium 131 (*)    Glucose, Bld 114 (*)    Creatinine, Ser 1.72 (*)    Calcium 8.7 (*)    Albumin 2.4 (*)    Alkaline Phosphatase 150 (*)    GFR calc non Af Amer 54 (*)    All other components within normal limits  CULTURE, BLOOD (ROUTINE X 2)  CULTURE, BLOOD (ROUTINE X 2)  URINE CULTURE  RESPIRATORY PANEL BY RT PCR (FLU A&B, COVID)  LACTIC ACID, PLASMA  LACTIC ACID, PLASMA  APTT  PROTIME-INR  URINALYSIS, ROUTINE W REFLEX MICROSCOPIC  HEMOGLOBIN A1C  T-HELPER CELLS (CD4) COUNT (NOT AT [facility])  CBG MONITORING, ED    EKG EKG Interpretation  Date/Time:  Monday Aug 21 2019 08:47:09 EDT Ventricular Rate:  103 PR Interval:    QRS Duration: 99 QT Interval:  343 QTC Calculation: 449 R Axis:   75 Text Interpretation: Sinus tachycardia RSR' in V1 or V2, probably normal variant No prior ECG for comparison. No STEMI Confirmed by Antony Blackbird [phone]) on 08/21/2019 9:00:30 AM   Radiology DG Forearm Right  Result Date: 08/21/2019 CLINICAL DATA:  Fall EXAM: RIGHT FOREARM - 2 VIEW COMPARISON:  None. FINDINGS: There is no evidence of fracture or other focal bone lesions. Soft tissues are unremarkable. IMPRESSION: Negative. Electronically Signed   By: Ulyses Jarred M.D.   On: 08/21/2019 03:01    Procedures .Critical Care Performed by: Franchot Heidelberg, PA-C Authorized by: Franchot Heidelberg, PA-C   Critical care provider statement:    Critical care time (minutes):  45   Critical care time was exclusive of:  Separately billable procedures and treating other patients and teaching  time   Critical care was necessary to treat or prevent imminent or life-threatening deterioration of the following conditions:  Sepsis   Critical care was time spent personally by me on the following activities:  Blood draw for specimens, development of treatment plan with patient or surrogate, discussions with consultants, evaluation of patient's response to treatment, examination of patient, obtaining history from patient or surrogate, ordering and performing treatments and interventions, ordering and review of laboratory studies, ordering and review of radiographic studies, pulse oximetry, re-evaluation of patient's condition and review of old charts   I assumed direction of critical care for this patient from another provider in my specialty: no   Comments:     Pt presenting meeting sirs criteria. Started on broad spectrum abx and admitted.    (including critical care time)  Medications Ordered in ED Medications  ceFEPIme (MAXIPIME) 2 g in sodium chloride 0.9 % 100 mL IVPB (has no administration in time range)  metroNIDAZOLE (FLAGYL) IVPB 500 mg (has no administration in time range)  0.9 %  sodium chloride infusion (has no administration in time range)  acetaminophen (TYLENOL) tablet 1,000 mg (has no administration in time range)  vancomycin (VANCOREADY) IVPB 1500 mg/300 mL (has no administration in time range)    ED Course  I have reviewed the triage vital signs and the nursing notes.  Pertinent labs & imaging results that were available during my care of the patient were reviewed by me and considered in my medical decision making (see chart for details).    MDM Rules/Calculators/A&P                      Patient presenting for evaluation of weakness.  I saw patient 7.5 hours into the ER stay.  On exam, patient appears ill.  Multiple possible sources of infection, patient  with thrush, recent pneumonia, and urinary symptoms.  Also concern for uncontrolled HIV.  Consider Covid.  Code  sepsis called, as patient meets SIRS criteria due to being febrile, tachycardic, and with a white count.  Will start broad-spectrum antibiotics.  Blood pressure stable at this time, will hold on 30 cc/kg bolus, but still give gentle fluids.   Labs obtained from triage interpreted by me, shows leukocytosis at 13.  Creatinine elevated from baseline, consider dehydration versus AKI versus UTI.  Hemoglobin 7.5, per chart review this is baseline from January 2021 patient remained in the low sevens.  Remaining sepsis labs ordered.  We will add on A1c to assess her diabetes as well as a CD4 count to assess HIV status.  Chest x-ray viewed interpreted by me, no pneumonia.  Still waiting on a urine, patient is aware we need sample.  She will need admission to the hospital for sepsis, consider source being AIDS versus UTI vs other.  Discussed with internal medicine teaching service, patient to be admitted.  Final Clinical Impression(s) / ED Diagnoses Final diagnoses:  Sepsis, due to unspecified organism, unspecified whether acute organ dysfunction present [facility])    Rx / DC Orders ED Discharge Orders    None       Alveria Apley, PA-C 08/21/19 [id number]    Tegeler, Canary Brim, MD 08/21/19 1027

## 2019-08-21 NOTE — H&P (Addendum)
Date: 08/21/2019               Patient Name:  Dustin [name] MRN: [id number]  DOB: 1995-10-13 Age / Sex: 24 y.o., adult   PCP: Patient, No Pcp Per         Medical Service: Internal Medicine Teaching Service         Attending Physician: Dr. Sid Falcon, MD    First Contact: Dr. Charleen Kirks Pager: [phone]  Second Contact: Dr. Sherry Ruffing Pager: [phone]       After Hours (After 5p/  First Contact Pager: [phone]  weekends / holidays): Second Contact Pager: [phone]   Chief Complaint: Weakness  History of Present Illness: Dustin [name] is a 24 y.o transgender male with HIV/AIDS who presented to the [facility] ED with 1 week of progressive weakness. History was obtained via the patient and through chart review.   Approximately 1.5 weeks ago the patient noticed polydipsia and polyuria in addition to a productive cough. She states that the cough is productive of yellow/green phlegm. This progressed to generalized weakness, described as asthenia. For the last week she has remained in bed because she feels that any physical activity is exhausting. She has been having food delivered to her house and that is how she has been able to eat and drink. She frequently gets up to use the restroom. Last night when she got up to use the restroom she developed blurry vision and fell to the ground. She denies loss of consciousness or trauma to her head. This is what prompted her to come to the emergency department. [place] approximately [place] and stated a condo. She otherwise lives with a girlfriend. She denies sick contact. She has no exposure to animals. She does not work.  She does state that in 04/2019 she was hospitalized for PNA. On review of records presented to the [facility] with complaints of shortness of breath, cough, fevers, and chills. Chest x-ray illustrated a dense consolidation in the right middle and lower lobe consistent with pneumonia. She was  subsequently admitted and treated with ceftriaxone and azithromycin. During her hospitalization she was also treated for oral thrush and latent syphilis. She states that she completed three weeks of penicillin injections for her Sevilis.  In regards to her HIV. She was diagnosed in 2017. She is been off all medications for the past year. She recently moved here from [place] and has not established with ID. Her last CD4 count was 20 and her viral load was 130,000. She was supposed to follow-up as an outpatient but unfortunately did not make her appointment.  ROS: She denies any fevers/chills, headaches, blurry vision, double vision, sinus congestion, rhinorrhea, sore throat, dysphagia, odynphagia, nausea/vomiting, abdominal pain, diarrhea, myalgias, arthralgias, new rash. She endorses constipation and occasional bilateral foot pain.  Meds:  Current Meds  Medication Sig  . lisinopril (ZESTRIL) 20 MG tablet Take 20 mg by mouth daily.   Allergies: Allergies as of 08/21/2019  . (No Known Allergies)   Past Medical History:  Diagnosis Date  . Acute appendicitis without mention of peritonitis 12/23/2012  . Allergy   . Furuncle 04/05/2017  . History of nephrolithiasis 12/23/2012   He had some hematuria on admit, asymptomatic  . HIV (human immunodeficiency virus infection) ([place])   . Kidney stones   . Syphilis (acquired)    Family History: Endorses a family history of DM in her grandmother and uncle. Denies family history of  HTN, COPD, HF, CAD, CVA, malignancy.   Social History: Moved to [place] from [place]. Lives with a friend currently. Does not work. Does not use EtOH. Smoked 1.2 PPD. Smokes marijuana occasionally   Review of Systems: A complete ROS was negative except as per HPI.   Physical Exam: Blood pressure 121/69, pulse (!) 104, temperature 99 F (37.2 C), temperature source Oral, resp. rate 18, height _0  (1.956 m), weight 75 kg, SpO2 100 %.  General: Thin male in no acute  distress HENT: Normocephalic, atraumatic, moist mucus membranes, white plaques on the tongue that can be removed with scrapping  Pulm: Good air movement with no wheezing or crackles  CV: RRR, no murmurs, no rubs  Abdomen: Active bowel sounds, soft, non-distended, no tenderness to palpation  Extremities: Pulses palpable in all extremities, no LE edema  Skin: Warm and dry  Neuro: Alert and oriented x 3  EKG: personally reviewed my interpretation is sinus tachycardia.   CXR: personally reviewed my interpretation is: good inspiration and penetration. Slightly rotated. No bony or soft tissue abnormalities. No pulmonary opacities. No increased pulmonary congestion. No enlargement of the cardiac silhouette or mediastinum.  Assessment & Plan by Problem: Active Problems:   Sepsis ([place])  Dustin [name] is a 24 y.o transgender male with HIV/AIDS who presented to the [facility] ED with 1 week of progressive weakness. She was subsequently found to be febrile with a Tmax of 102.2 and tachycardic. Initial lab work illustrated a leukocytosis with left shift and an AKI. She was started on broad-spectrum antibiotics and admitted for further evaluation/management.  SIRS/Sepsis  - Found to be febrile, tachycardic, and have a leukocytosis with evidence of end organ dysfunction. Unclear source at this time. Chest x-ray clear without any opacity/infiltrate. Respiratory swabs (influenza and COVID) negative. - Follow-up UA and urine cultures. - Follow-up blood cultures. - Check urine histo antigen (although lack of pancytopenia makes this less likely) - No risk factors for TB and no symptoms to suggest crypto. Needs to start antiretroviral treatment. ID consulted - Will need prophylaxis against PJP and MAC, will defer to ID - Continue Vancomycin and Cefepime   AKI  - Baseline creatine 0.5 to 0.6 (labs in January) - Elevated to 1.72 today  - Likely hemodynamically mediated  - Given IVF in ED. Will follow-up BMP -  Follow-up UA - If UA is consistent with GN or if creatine does not respond to IVF will pursue urine studies and renal ultrasound.   Gamma gap  - Likely related to underlying HIV/AIDs vs acute infection  - Continue to monitor   Elevated Alk Phos  - No other liver enzyme elevation. Patient at risk for histo and blasto which could cause elevation; however, presentation less consistent with blasto.  - Check urine histo antigen, GGT and vitamin D  HIV/AIDs - Last CD4 count of 20 with elevated viral load  - Will need prophylaxis against PJP and MAC, will defer to ID - Will consult ID  Acute on chronic anemia  - Ferritin on 05/02/19 > 1500 - Iron saturation at 6% - Baseline Hgb 7-8  - Need to treat underlying HIV/AIDs  Thrush - Able to scrap off  - No odynophagia or dysphagia  - Treat with oral nystatin four times daily  HTN - Previously diagnosed and started on lisinopril  - Holding lisinopril in setting of AKI  Diet: Regular  VTE ppx: Lovenox (renal adjustment) CODE STATUS: Full Code  Dispo: Admit patient to Inpatient with expected length  of stay greater than 2 midnights.  SignedIna Homes, MD 08/21/2019, 10:59 AM  Pager: [phone]

## 2019-08-21 NOTE — Progress Notes (Signed)
Pharmacy Antibiotic Note  Dustin [name] is a 24 y.o. adult admitted on 08/21/2019 with sepsis.  Pharmacy has been consulted for vancomycin and cefepime dosing.  WBC 13, Temp 102.2, tachy  Plan: Vancomycin 1500 mg IV x 1, then 750 mg IV every 12 hours Goal AUC 400-550. Expected AUC: 446 SCr used: 1.72 Cefepime 2g IV every 12 hours Monitor renal function, Cx and clinical progression to narrow Vancomycin levels at steady state   Height: 6\' 5"  (195.6 cm) Weight: 75 kg (165 lb 5.5 oz) IBW/kg (Calculated) : 89.1  Temp (24hrs), Avg:99.8 F (37.7 C), Min:99.8 F (37.7 C), Max:99.8 F (37.7 C)  Recent Labs  Lab 08/21/19 0233  WBC 13.0*  CREATININE 1.72*    Estimated Creatinine Clearance (by C-G formula based on SCr of 1.72 mg/dL (H)) Male: 10/21/19 mL/min (A) Male: 69.6 mL/min (A)    No Known Allergies  77.3, PharmD Clinical Pharmacist ED Pharmacist Phone # [phone] 08/21/2019 8:56 AM

## 2019-08-21 NOTE — Progress Notes (Signed)
   08/21/19 2030  Vitals  Temp 99.7 F (37.6 C)  Temp Source Oral  BP (!) 150/103  MAP (mmHg) 113  BP Location Right Arm  BP Method Automatic  Patient Position (if appropriate) Lying  Pulse Rate 90  Pulse Rate Source Dinamap  Resp 18  Oxygen Therapy  SpO2 100 %  O2 Device Room Air  Pain Assessment  Pain Scale 0-10  Pain Score 3  Pain Location Arm  Pain Orientation Right  Pain Descriptors / Indicators Aching (fell on it yesterday)  MEWS Score  MEWS Temp 0  MEWS Systolic 0  MEWS Pulse 0  MEWS RR 0  MEWS LOC 0  MEWS Score 0  MEWS Score Color Green   New patient admitted through ED. A/Ox4 on arrival. MAE, NAD. Oriented to room and unit and placed on cardiac monitor, CCMD notified. Patient made comfortable in bed and call bell placed within reach. Will cont. Monitor.

## 2019-08-21 NOTE — ED Triage Notes (Addendum)
Patient reports generalized weakness/fatigue , lightheaded and fell on carpeted floor this evening at home , denies LOC / alert and oriented , respirations unlabored , patient added right forearm pain .

## 2019-08-21 NOTE — Consult Note (Signed)
[facility]    Date of Admission:  08/21/2019           Day 1 vancomycin        Day 1 cefepime       Reason for Consult: Sepsis in the setting of advanced, untreated HIV infection    Referring Provider: Dr. Levora Dredge Primary Care Provider: None  Assessment: The cause of her fever, tachycardia and weakness is unclear.  She is certainly at risk for all opportunistic infections related to advanced, untreated HIV infection.  I agree with empiric vancomycin and cefepime pending the results of routine blood cultures.  We will obtain an AFB blood culture to look for evidence of disseminated Mycobacterium avium infection.  She is interested in getting into care for her infection.  She does have transportation and says that she would be able to get from [place] to our clinic here in [place].  I will start her back on Biktarvy and full dose fluconazole.  I will also start prophylactic doses of trimethoprim sulfamethoxazole and restart therapy for latent syphilis.  Plan: 1. Continue vancomycin and cefepime 2. Start Biktarvy, fluconazole and Trimethoprim/Sulfamethoxazole 3. AFB blood culture 4. Screen for GC and chlamydia  Principal Problem:   Sepsis (HCC) Active Problems:   HIV infection (HCC)   History of nephrolithiasis   [name]   AKI (acute kidney injury) (HCC)   Microcytic anemia   Depression   Scheduled Meds: . enoxaparin (LOVENOX) injection  40 mg Subcutaneous Q24H  . nystatin  5 mL Oral QID   Continuous Infusions: . sodium chloride Stopped (08/21/19 1043)  . ceFEPime (MAXIPIME) IV    . lactated ringers    . vancomycin    . vancomycin     PRN Meds:.acetaminophen **OR** acetaminophen, ondansetron **OR** ondansetron (ZOFRAN) IV, polyethylene glycol  HPI: Dustin [name] is a 24 y.o. adult transgender male who was diagnosed with acute HIV infection in 2017.  She did not enter care at that time.  We  saw her when she was hospitalized severe pharyngitis in December 2018.  Her CD4 count was 320 and her HIV viral load was 20,300 at that time.  She did follow-up in our clinic shortly after that visit.  She was given a supply of Biktarvy which she says she took for "a while".  She did not follow-up in clinic.  She was rehospitalized and November 2019 with probable pneumocystis pneumonia and syphilis.  Her CD4 count had plummeted to 30 and her HIV viral load was 540,000.  She did not follow-up with [place] after discharge.  She says that she moved back to [place], [place] but did not receive care for HIV while there.  She moved back to [place] about 1 year ago and has been living with a male friend.  She was hospitalized at [facility] with recurrent pneumonia, thrush and latent syphilis.  She received 1 dose of IM penicillin while hospitalized.  She was given a follow-up appointment in ID clinic at [facility] but says that she did not keep that appointment because "they were so confusing".  Recently she became progressively weaker and had episodes where she was extremely hot.  She came back to the ED here last night where she had a temperature of 102.2 degrees.  She had mild leukocytosis and tachycardia leading to admission.  She says that she has been feeling depressed recently.   Review  of Systems: Review of Systems  Constitutional: Positive for fever, malaise/fatigue and weight loss. Negative for chills and diaphoresis.  HENT: Negative for congestion and sore throat.   Eyes: Negative for blurred vision.  Respiratory: Negative for cough, sputum production and shortness of breath.   Cardiovascular: Negative for chest pain.  Gastrointestinal: Negative for diarrhea, nausea and vomiting.  Genitourinary: Negative for dysuria.  Skin: Negative for rash.  Neurological: Negative for headaches.  Psychiatric/Behavioral: Positive for depression. Negative for substance abuse.    Past Medical  History:  Diagnosis Date  . Acute appendicitis without mention of peritonitis 12/23/2012  . Allergy   . Furuncle 04/05/2017  . History of nephrolithiasis 12/23/2012   Dustin [name] had some hematuria on admit, asymptomatic  . HIV (human immunodeficiency virus infection) ([place])   . Kidney stones   . Syphilis (acquired)     Social History   Tobacco Use  . Smoking status: Current Every Day Smoker    Packs/day: 1.00    Types: Cigarettes  . Smokeless tobacco: Never Used  Substance Use Topics  . Alcohol use: Yes    Alcohol/week: 2.0 standard drinks    Types: 2 Cans of beer per week  . Drug use: Yes    Types: Marijuana    Family History  Problem Relation Age of Onset  . Heart disease Mother   . Drug abuse Father   . Diabetes Father   . Hypertension Father   . Hyperlipidemia Father   . Kidney disease Paternal Grandmother   . Stroke Paternal Grandmother   . Diabetes Paternal Grandfather   . Hyperlipidemia Paternal Grandfather   . Hypertension Paternal Grandfather    No Known Allergies  OBJECTIVE: Blood pressure 124/80, pulse 86, temperature 99 F (37.2 C), temperature source Oral, resp. rate 19, height 6\' 5"  (1.956 m), weight 75 kg, SpO2 100 %.  Physical Exam Constitutional:      Comments: She was resting quietly on a stretcher in a darkened ED room.  Initially she was very lethargic and hard to understand but became more alert and talkative as the exam went on.  HENT:     Mouth/Throat:     Comments: Extensive thrush. Cardiovascular:     Rate and Rhythm: Normal rate and regular rhythm.     Heart sounds: No murmur.  Pulmonary:     Effort: Pulmonary effort is normal.     Breath sounds: Normal breath sounds.  Abdominal:     Palpations: Abdomen is soft.     Tenderness: There is no abdominal tenderness.  Musculoskeletal:        General: No swelling or tenderness.     Right lower leg: No edema.     Left lower leg: No edema.  Skin:    Findings: No rash.  Neurological:     General:  No focal deficit present.  Psychiatric:        Mood and Affect: Mood normal.     Lab Results Lab Results  Component Value Date   WBC 13.0 (H) 08/21/2019   HGB 7.5 (L) 08/21/2019   HCT 24.0 (L) 08/21/2019   MCV 72.7 (L) 08/21/2019   PLT 512 (H) 08/21/2019    Lab Results  Component Value Date   CREATININE 1.72 (H) 08/21/2019   BUN 18 08/21/2019   NA 131 (L) 08/21/2019   K 3.5 08/21/2019   CL 99 08/21/2019   CO2 24 08/21/2019    Lab Results  Component Value Date   ALT 30 08/21/2019  AST 24 08/21/2019   ALKPHOS 150 (H) 08/21/2019   BILITOT 0.4 08/21/2019     Microbiology: Recent Results (from the past 240 hour(s))  Respiratory Panel by RT PCR (Flu A&B, Covid) - Nasopharyngeal Swab     Status: None   Collection Time: 08/21/19  9:00 AM   Specimen: Nasopharyngeal Swab  Result Value Ref Range Status   SARS Coronavirus 2 by RT PCR NEGATIVE NEGATIVE Final    Comment: (NOTE) SARS-CoV-2 target nucleic acids are NOT DETECTED. The SARS-CoV-2 RNA is generally detectable in upper respiratoy specimens during the acute phase of infection. The lowest concentration of SARS-CoV-2 viral copies this assay can detect is 131 copies/mL. A negative result does not preclude SARS-Cov-2 infection and should not be used as the sole basis for treatment or other patient management decisions. A negative result may occur with  improper specimen collection/handling, submission of specimen other than nasopharyngeal swab, presence of viral mutation(s) within the areas targeted by this assay, and inadequate number of viral copies (<131 copies/mL). A negative result must be combined with clinical observations, patient history, and epidemiological information. The expected result is Negative. Fact Sheet for Patients:  https://www.moore.com/ Fact Sheet for Healthcare Providers:  https://www.young.biz/ This test is not yet ap proved or cleared by the [place]  FDA and  has been authorized for detection and/or diagnosis of SARS-CoV-2 by FDA under an Emergency Use Authorization (EUA). This EUA will remain  in effect (meaning this test can be used) for the duration of the COVID-19 declaration under Section 564(b)(1) of the Act, 21 U.S.C. section 360bbb-3(b)(1), unless the authorization is terminated or revoked sooner.    Influenza A by PCR NEGATIVE NEGATIVE Final   Influenza B by PCR NEGATIVE NEGATIVE Final    Comment: (NOTE) The Xpert Xpress SARS-CoV-2/FLU/RSV assay is intended as an aid in  the diagnosis of influenza from Nasopharyngeal swab specimens and  should not be used as a sole basis for treatment. Nasal washings and  aspirates are unacceptable for Xpert Xpress SARS-CoV-2/FLU/RSV  testing. Fact Sheet for Patients: https://www.moore.com/ Fact Sheet for Healthcare Providers: https://www.young.biz/ This test is not yet approved or cleared by the [place] FDA and  has been authorized for detection and/or diagnosis of SARS-CoV-2 by  FDA under an Emergency Use Authorization (EUA). This EUA will remain  in effect (meaning this test can be used) for the duration of the  Covid-19 declaration under Section 564(b)(1) of the Act, 21  U.S.C. section 360bbb-3(b)(1), unless the authorization is  terminated or revoked. Performed at [facility], [address] [place], [place] [address]     Cliffton Asters, MD [facility] [phone] pager   [phone] cell 08/21/2019, 12:33 PM

## 2019-08-22 ENCOUNTER — Inpatient Hospital Stay (HOSPITAL_COMMUNITY): Payer: Medicaid - Out of State

## 2019-08-22 DIAGNOSIS — F640 Transsexualism: Secondary | ICD-10-CM

## 2019-08-22 DIAGNOSIS — R6520 Severe sepsis without septic shock: Secondary | ICD-10-CM

## 2019-08-22 DIAGNOSIS — D509 Iron deficiency anemia, unspecified: Secondary | ICD-10-CM

## 2019-08-22 DIAGNOSIS — R748 Abnormal levels of other serum enzymes: Secondary | ICD-10-CM

## 2019-08-22 LAB — CBC WITH DIFFERENTIAL/PLATELET
Abs Immature Granulocytes: 0.12 10*3/uL — ABNORMAL HIGH (ref 0.00–0.07)
Basophils Absolute: 0 10*3/uL (ref 0.0–0.1)
Basophils Relative: 0 %
Eosinophils Absolute: 0 10*3/uL (ref 0.0–0.5)
Eosinophils Relative: 0 %
HCT: 21.7 % — ABNORMAL LOW (ref 39.0–52.0)
Hemoglobin: 6.8 g/dL — CL (ref 13.0–17.0)
Immature Granulocytes: 1 %
Lymphocytes Relative: 3 %
Lymphs Abs: 0.3 10*3/uL — ABNORMAL LOW (ref 0.7–4.0)
MCH: 22.4 pg — ABNORMAL LOW (ref 26.0–34.0)
MCHC: 31.3 g/dL (ref 30.0–36.0)
MCV: 71.6 fL — ABNORMAL LOW (ref 80.0–100.0)
Monocytes Absolute: 0.5 10*3/uL (ref 0.1–1.0)
Monocytes Relative: 5 %
Neutro Abs: 8 10*3/uL — ABNORMAL HIGH (ref 1.7–7.7)
Neutrophils Relative %: 91 %
Platelets: 472 10*3/uL — ABNORMAL HIGH (ref 150–400)
RBC: 3.03 MIL/uL — ABNORMAL LOW (ref 4.22–5.81)
RDW: 18.5 % — ABNORMAL HIGH (ref 11.5–15.5)
WBC: 8.9 10*3/uL (ref 4.0–10.5)
nRBC: 1 % — ABNORMAL HIGH (ref 0.0–0.2)

## 2019-08-22 LAB — PREPARE RBC (CROSSMATCH)

## 2019-08-22 LAB — COMPREHENSIVE METABOLIC PANEL
ALT: 22 U/L (ref 0–44)
AST: 16 U/L (ref 15–41)
Albumin: 2 g/dL — ABNORMAL LOW (ref 3.5–5.0)
Alkaline Phosphatase: 112 U/L (ref 38–126)
Anion gap: 9 (ref 5–15)
BUN: 19 mg/dL (ref 6–20)
CO2: 20 mmol/L — ABNORMAL LOW (ref 22–32)
Calcium: 8.7 mg/dL — ABNORMAL LOW (ref 8.9–10.3)
Chloride: 105 mmol/L (ref 98–111)
Creatinine, Ser: 1.5 mg/dL — ABNORMAL HIGH (ref 0.61–1.24)
GFR calc Af Amer: >60 mL/min (ref >60)
GFR calc non Af Amer: >60 mL/min (ref >60)
Glucose, Bld: 118 mg/dL — ABNORMAL HIGH (ref 70–99)
Potassium: 3.4 mmol/L — ABNORMAL LOW (ref 3.5–5.1)
Sodium: 134 mmol/L — ABNORMAL LOW (ref 135–145)
Total Bilirubin: 0.4 mg/dL (ref 0.3–1.2)
Total Protein: 5.7 g/dL — ABNORMAL LOW (ref 6.5–8.1)

## 2019-08-22 LAB — T-HELPER CELLS (CD4) COUNT (NOT AT ARMC)
CD4 % Helper T Cell: 5 % — ABNORMAL LOW (ref 33–65)
CD4 T Cell Abs: <35 /uL — ABNORMAL LOW (ref 400–1790)

## 2019-08-22 LAB — SAVE SMEAR(SSMR), FOR PROVIDER SLIDE REVIEW

## 2019-08-22 LAB — LACTATE DEHYDROGENASE: LDH: 122 U/L (ref 98–192)

## 2019-08-22 LAB — HEMOGLOBIN AND HEMATOCRIT, BLOOD
HCT: 25.6 % — ABNORMAL LOW (ref 39.0–52.0)
Hemoglobin: 8.2 g/dL — ABNORMAL LOW (ref 13.0–17.0)

## 2019-08-22 LAB — ABO/RH: ABO/RH(D): A POS

## 2019-08-22 MED ORDER — VITAMIN D (ERGOCALCIFEROL) 1.25 MG (50000 UNIT) PO CAPS
50000.0000 [IU] | ORAL_CAPSULE | ORAL | Status: DC
  Administered 2019-08-22: 50000 [IU] via ORAL
  Filled 2019-08-22: qty 1

## 2019-08-22 MED ORDER — AZITHROMYCIN 600 MG PO TABS
1200.0000 mg | ORAL_TABLET | ORAL | Status: DC
  Administered 2019-08-22: 11:00:00 1200 mg via ORAL
  Filled 2019-08-22: qty 2

## 2019-08-22 MED ORDER — LACTATED RINGERS IV BOLUS
1000.0000 mL | Freq: Once | INTRAVENOUS | Status: AC
  Administered 2019-08-22: 11:00:00 1000 mL via INTRAVENOUS

## 2019-08-22 MED ORDER — LACTATED RINGERS IV SOLN: INTRAVENOUS | Status: AC

## 2019-08-22 MED ORDER — POTASSIUM CHLORIDE CRYS ER 20 MEQ PO TBCR
40.0000 meq | EXTENDED_RELEASE_TABLET | Freq: Once | ORAL | Status: AC
  Administered 2019-08-22: 40 meq via ORAL
  Filled 2019-08-22: qty 2

## 2019-08-22 MED ORDER — SODIUM CHLORIDE 0.9% IV SOLUTION: Freq: Once | INTRAVENOUS | Status: AC

## 2019-08-22 NOTE — Progress Notes (Signed)
CRITICAL VALUE ALERT  Critical Value:  Hgb 6.8  Date & Time Notied:  08/22/2019 0952am  Provider Notified: Provider Paged at [phone]  Orders Received/Actions taken: Dr. Huel Cote called back awaiting orders

## 2019-08-22 NOTE — Progress Notes (Signed)
Subjective:   Dustin [name] states she is feeling okay this morning. She continues to have minimal dizziness but denies any generalized weakness. She did have some gagging this AM with nausea, but denies vomiting, abdominal pain, diarrhea, hematochezia, melena, hematuria. Dustin [name] denies any know sources of bleeding at this time. She is upset that she has continually been losing weight despite good PO intake. She does not feel this is related to her severe HIV infection.   We discussed plan to work up AKI further with ultrasound, as well as ultrasound the liver due to elevated alk phos yesterday. We also discussed anemia and she is in agreement with transfusion.   Objective:  Vital signs in last 24 hours: Vitals:   08/21/19 1200 08/21/19 1500 08/21/19 2030 08/22/19 0618  BP: 124/80 117/84 (!) 150/103 125/78  Pulse: 86 73 90 86  Resp: _0 Temp:   99.7 F (37.6 C) 98.9 F (37.2 C)  TempSrc:   Oral Oral  SpO2: 100% 98% 100% 99%  Weight:      Height:        Physical Exam Vitals and nursing note reviewed.  Constitutional:      General: She is not in acute distress.    Appearance: She is normal weight.  HENT:     Head: Normocephalic and atraumatic.  Eyes:     Extraocular Movements: Extraocular movements intact.     Comments: Conjunctival pallor present  Cardiovascular:     Rate and Rhythm: Normal rate and regular rhythm.     Heart sounds: No murmur.  Pulmonary:     Effort: Pulmonary effort is normal. No respiratory distress.     Breath sounds: Normal breath sounds. No wheezing or rales.  Abdominal:     General: Abdomen is scaphoid. Bowel sounds are normal. There is no distension.     Palpations: Abdomen is soft.     Tenderness: There is no abdominal tenderness. There is no guarding. Negative signs include Murphy's sign.  Neurological:     Mental Status: She is alert.    Assessment/Plan:  Principal Problem:   Sepsis ([place]) Active Problems:   History of  nephrolithiasis   HIV infection ([place])   [name]   AKI (acute kidney injury) ([place])   Microcytic anemia   Depression   Latent syphilis  Dustin [name] Dustin [name] is a 24 y.o transgender male with HIV/AIDS who presented to the [facility] ED with 1 week of progressive weakness. She was subsequently found to be febrile with a Tmax of 102.2 and tachycardic. Initial lab work illustrated a leukocytosis with left shift and an AKI. She was started on broad-spectrum antibiotics and admitted for further evaluation/management.  # Febrile Episode  Pt initially febrile, tachycardic, and with a leukocytosis with evidence of end organ dysfunction. No febrile episodes now since admission with improvements in both leukocytosis and tachycardia.  Unclear source at this time. Chest x-ray clear without any opacity/infiltrate. Respiratory swabs (influenza and COVID) negative. Given patient's significant immunocompromised state, will continue antibiotics at this time but will consider discontinuing Vanc/Cefepime if continues to improve in the next 24 hours or so.   - ID on board; appreciate their recommendations  - Follow blood and urine cultures  - Continue Vancomycin and Cefepime   # Acute Kidney Injury   Patient's creatinine was elevated to 1.72 on admission and improved only to 1.57 with IVF. Protein/creatinine ratio elevated at 0.77.   Given there was not much improvement with IVF resuscitation,  I am very concern for HIV-associated nephropathy. Will start evaluation with imaging and continue more aggressive IVF. May eventually require biopsy.   - Trend creatinine  - Renal ultrasound  - 1 L LR bolus followed by 125 cc/hr maintenance fluids   # HIV with progression to AIDs CD4 count is <35, so essentially undetectable. He has been started on prophylactic treatment for PJP and MAC with Dapsone.   - ID following, appreciate their recommendations  - Biktarvy initiated  - Prophylaxis with Dapsone  (Bactrim contraindicated due to AKI)  # Acute on Chronic Iron Deficiency Anemia  Iron saturation at 6% and iron levels of 11 on 04/2019. Per chart review, his baseline hemoglobin has been between 7-8 for a few months now. Unfortunately, will need to hold off on iron supplementation until HIV and possible additional infectious sources have been treated.   - Transfuse for hemoglobin <7 - S/p 1 unit of pRBCs today  - post-transfusion CBC pending   # Elevated Alk Phos  Elevated GGT today, although alk phos improved. Cholestatic pattern, will obtain imaging although asymptomatic.   - Liver ultrasound   # Unintentional Weight Loss  Weight varied between 103-106 kg in the 2019, now sustaining between 71-75 kg. Likely secondary to HIV-related cachexia.    - Consult dietician   # Vitamin D Deficiency  Vitamin D level of 7.   - Vitamin D2 50,000 units q7d  # Thrush No odynophagia or dysphagia   - Oral Nystatin QID - Full dose Fluconazole   # Hypertension - Previously diagnosed and started on lisinopril  - Holding lisinopril in setting of AKI  Dispo: Anticipated discharge in approximately 2-3 day(s).   Dr. Jose Persia Internal Medicine PGY-1  Pager: [phone] 08/22/2019, 7:57 AM

## 2019-08-23 DIAGNOSIS — A4151 Sepsis due to Escherichia coli [E. coli]: Secondary | ICD-10-CM

## 2019-08-23 LAB — COMPREHENSIVE METABOLIC PANEL
ALT: 19 U/L (ref 0–44)
AST: 15 U/L (ref 15–41)
Albumin: 1.9 g/dL — ABNORMAL LOW (ref 3.5–5.0)
Alkaline Phosphatase: 93 U/L (ref 38–126)
Anion gap: 7 (ref 5–15)
BUN: 15 mg/dL (ref 6–20)
CO2: 20 mmol/L — ABNORMAL LOW (ref 22–32)
Calcium: 8.5 mg/dL — ABNORMAL LOW (ref 8.9–10.3)
Chloride: 110 mmol/L (ref 98–111)
Creatinine, Ser: 1.36 mg/dL — ABNORMAL HIGH (ref 0.61–1.24)
GFR calc Af Amer: >60 mL/min (ref >60)
GFR calc non Af Amer: >60 mL/min (ref >60)
Glucose, Bld: 138 mg/dL — ABNORMAL HIGH (ref 70–99)
Potassium: 3.6 mmol/L (ref 3.5–5.1)
Sodium: 137 mmol/L (ref 135–145)
Total Bilirubin: 0.4 mg/dL (ref 0.3–1.2)
Total Protein: 5.4 g/dL — ABNORMAL LOW (ref 6.5–8.1)

## 2019-08-23 LAB — CBC WITH DIFFERENTIAL/PLATELET
Abs Immature Granulocytes: 0.1 10*3/uL — ABNORMAL HIGH (ref 0.00–0.07)
Basophils Absolute: 0 10*3/uL (ref 0.0–0.1)
Basophils Relative: 0 %
Eosinophils Absolute: 0.1 10*3/uL (ref 0.0–0.5)
Eosinophils Relative: 1 %
HCT: 24.9 % — ABNORMAL LOW (ref 39.0–52.0)
Hemoglobin: 7.9 g/dL — ABNORMAL LOW (ref 13.0–17.0)
Immature Granulocytes: 2 %
Lymphocytes Relative: 5 %
Lymphs Abs: 0.3 10*3/uL — ABNORMAL LOW (ref 0.7–4.0)
MCH: 23.4 pg — ABNORMAL LOW (ref 26.0–34.0)
MCHC: 31.7 g/dL (ref 30.0–36.0)
MCV: 73.7 fL — ABNORMAL LOW (ref 80.0–100.0)
Monocytes Absolute: 0.5 10*3/uL (ref 0.1–1.0)
Monocytes Relative: 7 %
Neutro Abs: 5.7 10*3/uL (ref 1.7–7.7)
Neutrophils Relative %: 85 %
Platelets: 430 10*3/uL — ABNORMAL HIGH (ref 150–400)
RBC: 3.38 MIL/uL — ABNORMAL LOW (ref 4.22–5.81)
RDW: 19.2 % — ABNORMAL HIGH (ref 11.5–15.5)
WBC: 6.7 10*3/uL (ref 4.0–10.5)
nRBC: 1.3 % — ABNORMAL HIGH (ref 0.0–0.2)

## 2019-08-23 LAB — HISTOPLASMA ANTIGEN, URINE: Histoplasma Antigen, urine: <0.5 (ref <0.5)

## 2019-08-23 LAB — PATHOLOGIST SMEAR REVIEW

## 2019-08-23 LAB — URINE CULTURE: Culture: 10000 — AB

## 2019-08-23 LAB — GC/CHLAMYDIA PROBE AMP (~~LOC~~) NOT AT ARMC
Chlamydia: NEGATIVE
Comment: NEGATIVE
Comment: NORMAL
Neisseria Gonorrhea: NEGATIVE

## 2019-08-23 LAB — MAGNESIUM: Magnesium: 1.5 mg/dL — ABNORMAL LOW (ref 1.7–2.4)

## 2019-08-23 LAB — BPAM RBC
Blood Product Expiration Date: 202105112359
ISSUE DATE / TIME: 202105041225
Unit Type and Rh: 6200

## 2019-08-23 LAB — TYPE AND SCREEN
ABO/RH(D): A POS
Antibody Screen: NEGATIVE
Unit division: 0

## 2019-08-23 LAB — HAPTOGLOBIN: Haptoglobin: 356 mg/dL — ABNORMAL HIGH (ref 17–317)

## 2019-08-23 MED ORDER — MAGNESIUM SULFATE 4 GM/100ML IV SOLN
4.0000 g | Freq: Once | INTRAVENOUS | Status: AC
  Administered 2019-08-23: 11:00:00 4 g via INTRAVENOUS
  Filled 2019-08-23: qty 100

## 2019-08-23 MED ORDER — DAPSONE 100 MG PO TABS: 100.0000 mg | ORAL_TABLET | Freq: Every day | ORAL | 0 refills | Status: AC

## 2019-08-23 MED ORDER — ADULT MULTIVITAMIN W/MINERALS CH: 1.0000 | ORAL_TABLET | Freq: Every day | ORAL | Status: DC

## 2019-08-23 MED ORDER — FLUCONAZOLE 100 MG PO TABS: 100.0000 mg | ORAL_TABLET | Freq: Every day | ORAL | 0 refills | Status: AC

## 2019-08-23 MED ORDER — FERROUS SULFATE 325 (65 FE) MG PO TABS
325.0000 mg | ORAL_TABLET | Freq: Every day | ORAL | Status: DC
  Administered 2019-08-23: 11:00:00 325 mg via ORAL
  Filled 2019-08-23: qty 1

## 2019-08-23 MED ORDER — VITAMIN D (ERGOCALCIFEROL) 1.25 MG (50000 UNIT) PO CAPS: 50000.0000 [IU] | ORAL_CAPSULE | ORAL | 0 refills | Status: AC

## 2019-08-23 MED ORDER — ENSURE ENLIVE PO LIQD: 237.0000 mL | Freq: Three times a day (TID) | ORAL | Status: DC

## 2019-08-23 MED ORDER — BIKTARVY 50-200-25 MG PO TABS: 1.0000 | ORAL_TABLET | Freq: Every day | ORAL | 0 refills | Status: AC

## 2019-08-23 MED ORDER — BIKTARVY 50-200-25 MG PO TABS: 1.0000 | ORAL_TABLET | Freq: Every day | ORAL | 0 refills | Status: DC

## 2019-08-23 MED ORDER — CEPHALEXIN 500 MG PO CAPS: 500.0000 mg | ORAL_CAPSULE | Freq: Three times a day (TID) | ORAL | 0 refills | Status: AC

## 2019-08-23 MED ORDER — FERROUS SULFATE 325 (65 FE) MG PO TABS: 325.0000 mg | ORAL_TABLET | Freq: Every day | ORAL | 0 refills | Status: AC

## 2019-08-23 MED ORDER — CEPHALEXIN 500 MG PO CAPS
500.0000 mg | ORAL_CAPSULE | Freq: Three times a day (TID) | ORAL | Status: DC
  Administered 2019-08-23: 12:00:00 500 mg via ORAL
  Filled 2019-08-23: qty 1

## 2019-08-23 MED FILL — FERROUS SULFATE 325 MG TAB: 325 (65 FE) | 30 days supply | Qty: 30 | Fill #0

## 2019-08-23 MED FILL — VIT D2 1.25 MG (50,000 UNIT: 1.25 MG | 30 days supply | Qty: 5 | Fill #0

## 2019-08-23 MED FILL — FLUCONAZOLE 100 MG TABLET: 100 | 11 days supply | Qty: 11 | Fill #0

## 2019-08-23 MED FILL — BIKTARVY 50-200-25 MG TABS: 50-200-25 | 30 days supply | Qty: 30 | Fill #0

## 2019-08-23 MED FILL — DAPSONE 100 MG TABLET: 100 | 30 days supply | Qty: 30 | Fill #0

## 2019-08-23 MED FILL — CEPHALEXIN 500 MG CAPS: 500 | 2 days supply | Qty: 6 | Fill #0

## 2019-08-23 NOTE — Progress Notes (Addendum)
Initial Nutrition Assessment  DOCUMENTATION CODES:   Non-severe (moderate) malnutrition in context of chronic illness  INTERVENTION:   -Ensure Enlive po TID, each supplement provides 350 kcal and 20 grams of protein -MVI with minerals daily  NUTRITION DIAGNOSIS:   Moderate Malnutrition related to chronic illness(HIV) as evidenced by moderate fat depletion, moderate muscle depletion, severe muscle depletion.  GOAL:   Patient will meet greater than or equal to 90% of their needs  MONITOR:   PO intake, Supplement acceptance, Weight trends, Skin, I & O's, Labs  REASON FOR ASSESSMENT:   Consult Assessment of nutrition requirement/status  ASSESSMENT:   Dustin [name] is a 24 y.o transgender male with HIV/AIDS who presented to the [facility] ED with 1 week of progressive weakness. She was subsequently found to be febrile with a Tmax of 102.2 and tachycardic. Initial lab work illustrated a leukocytosis with left shift and an AKI. She was started on broad-spectrum antibiotics and admitted for further evaluation/management.  Pt admitted with SIRS/ sepsis.   Reviewed I/O's: +2.1 L x 24 hours and +4 L since admission  UOP: 1 L x 24 hours  Spoke with pt at bedside, who was pleasant and in good spirits ("I'm feeling so much better!"). She reports a general decline in health over the past year, which she related to multiple hospitalization (3-4) related to pneumonia. Per her report, the pneumonia "did not clear until recently- I could feel myself getting sick".   At baseline, pt has a very hearty appetite, sharing "I can throw back some food". Pt often consumes heavy meals from [employer] at least once a day in addition to "soul food" prepared by his roommate. Commonly consumed beverages include coffee, water, and soda. She has been consuming 100% of meals during hospitalization.    Pt reports she has always had a tall, slender figure and typically weight is about 195#. She expresses concern  about her weight loss, particularly over the past month, due to being hospitalized about 3 weeks ago for pneumonia. Pt estimates she hs lost about 30# (15%) over the past 3-6 months, which is significant for time frame, however, no documentation available to support this. Pt shared pictures of herself on her phone taken approximately 6 months ago- pt with drastic change in appearance (face is fuller with no depletions in clavicle).   Discussed importance of good meal and supplement intake to promote healing. She is willing to try Ensure supplements. Additionally, discussed importance of medication compliance. Per pt, "this has happened before; when I take my HIV meds, I gain weight real fast". Case discussed with RNCM.  Labs reviewed.   NUTRITION - FOCUSED PHYSICAL EXAM:    Most Recent Value  Orbital Region  Moderate depletion  Upper Arm Region  Moderate depletion  Thoracic and Lumbar Region  Moderate depletion  Buccal Region  Mild depletion  Temple Region  Moderate depletion  Clavicle Bone Region  Severe depletion  Clavicle and Acromion Bone Region  Severe depletion  Scapular Bone Region  Severe depletion  Dorsal Hand  Severe depletion  Patellar Region  Moderate depletion  Anterior Thigh Region  Moderate depletion  Posterior Calf Region  Moderate depletion  Edema (RD Assessment)  None  Hair  Reviewed  Eyes  Reviewed  Mouth  Reviewed  Skin  Reviewed  Nails  Reviewed       Diet Order:   Diet Order            Diet - low sodium heart healthy  Diet regular Room service appropriate? Yes; Fluid consistency: Thin  Diet effective now              EDUCATION NEEDS:   Education needs have been addressed  Skin:  Skin Assessment: Reviewed RN Assessment  Last BM:  Unknown  Height:   Ht Readings from Last 1 Encounters:  08/21/19 6\' 5"  (1.956 m)    Weight:   Wt Readings from Last 1 Encounters:  08/21/19 75 kg    Ideal Body Weight:  94.5 kg  BMI:  Body mass index  is 19.61 kg/m.  Estimated Nutritional Needs:   Kcal:  2400-2600  Protein:  150-165 grams  Fluid:  > 2.4 L    10/21/19, RD, LDN, CDCES Registered Dietitian II Certified Diabetes Care and Education Specialist Please refer to [facility] for RD and/or RD on-call/weekend/after hours pager

## 2019-08-23 NOTE — Progress Notes (Signed)
Pt given discharge summary and was discharged home via friend as transportation. Pt also given meds from [facility].

## 2019-08-23 NOTE — Progress Notes (Signed)
ID Pharmacy Progress Note    Dustin [name] has been approved through [name] for [employer] assistance with the billing information listed below.    BIN#: [id number] PCN: [id number] GROUP: [id number] MEMBER#: [id number]  [name] will fill a 30 day supply for her.    [name], PharmD, BCPS, BCIDP Infectious Diseases Clinical Pharmacist Phone: [phone] 08/23/2019 11:40 AM

## 2019-08-23 NOTE — Progress Notes (Signed)
Subjective:   Patient reports that she woke up and was sweating a lot. She denied any other issues today. Discussed the results of the imaging and labs that we did yesterday. Discussed that her urine has grown some bacteria. She does endorse increased urination and urge to urinate for the past 2 weeks. Denies any dysuria. Discussed that we will discuss with ID to see if we can adjust her antibiotics today.   Objective:  Vital signs in last 24 hours: Vitals:   08/22/19 1436 08/22/19 1829 08/23/19 0100 08/23/19 0609  BP: (!) 151/108 (!) 135/97 (!) 144/101 (!) 140/107  Pulse: 81 78 72 66  Resp: _0 Temp: 98.1 F (36.7 C) 98.3 F (36.8 C) 98.9 F (37.2 C) (!) 97.5 F (36.4 C)  TempSrc: Oral Oral Oral Oral  SpO2: 100% 97% 98% 100%  Weight:      Height:        Physical Exam Constitutional:      Appearance: Normal appearance.  HENT:     Head: Normocephalic and atraumatic.     Mouth/Throat:     Mouth: Mucous membranes are moist.  Cardiovascular:     Rate and Rhythm: Normal rate and regular rhythm.     Pulses: Normal pulses.  Pulmonary:     Effort: Pulmonary effort is normal.     Breath sounds: Normal breath sounds.  Abdominal:     General: Abdomen is flat.     Palpations: Abdomen is soft.  Skin:    General: Skin is warm and dry.  Neurological:     General: No focal deficit present.     Mental Status: She is alert and oriented to person, place, and time.    Assessment/Plan:  Principal Problem:   Sepsis ([place]) Active Problems:   History of nephrolithiasis   HIV infection ([place])   [name]   AKI (acute kidney injury) ([place])   Microcytic anemia   Depression   Latent syphilis   Alkaline phosphatase elevation  Dustin [name] is a 24 y.o transgender male with HIV/AIDS who presented to the [facility] ED with 1 week of progressive weakness.She was subsequently found to be febrile with a Tmax of 102.2 and tachycardic. Initial lab work  illustrated a leukocytosis with left shift and an AKI. She was started on broad-spectrum antibiotics and admitted for further evaluation/management.  # Febrile Episode 2/2 UTI Pt initially febrile,tachycardic, and with a leukocytosiswith evidence of end organ dysfunction. No febrile episodes now since admission with improvements in both leukocytosis and tachycardia. Urine cultures are growing pan-sensitive E. Coli, although not many colonies, given patient's urinary urgency and frequency and her profound immunocompromised state, will treat as complicated UTI with 5 days of antibiotics. Blood cultures are NGTD  - ID on board; appreciate their recommendations  -Follow blood and urine cultures  -DiscontinueVancomycin andCefepime - Keflex x 2 days to complete 5 days course   # Acute Kidney Injury   Patient's creatinine was elevated to 1.72 on admission. Minimal improvement with IVF. Protein/creatinine ratio elevated at 0.77. 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 Will need follow up to trend creatinine and further discuss obtaining biopsy.   - Trend creatinine  # HIV with progression to AIDs CD4 count is <35, so essentially undetectable. She has been started on Biktarvy and prophylactic treatment for PJP and MAC with Dapsone.   - ID following, appreciate their recommendations  - Continue  Biktarvy  - Prophylaxis with Dapsone (Bactrim contraindicated due to renal function)  # Acute on Chronic Iron Deficiency Anemia  Iron saturation at 6% and iron levels of 11 on 04/2019. Per chart review, his baseline hemoglobin has been between 7-8 for a few months now. Unfortunately, will need to hold off on IV iron supplementation until HIV and UTI have been treated. Will recommend oral supplemental though.   - Transfuse for hemoglobin <7 - S/p 1 unit of pRBCs on 08/22/19 - Ferrous sulfate 325 mg daily  # Unintentional  Weight Loss  Weight varied between 103-106 kg in the 2019, now sustaining between 71-75 kg. Likely secondary to HIV-related cachexia.    - Consult dietician   # Vitamin D Deficiency  Vitamin D level of 7.   - Vitamin D2 50,000 units q7d  # Thrush No odynophagia or dysphagia   - OralNystatin QID - Full dose Fluconazole   # Hypertension Previously diagnosed and started on lisinopril. BP has been stable with Lisinopril being held, so will continue to hold for now.   - Holding lisinopril in setting of AKI  # Elevated Alk Phos  Elevated GGT today, although alk phos improved. Cholestatic pattern, ultrasound negative for stones. Resolved at this time.   Dispo: Anticipated discharge in approximately 0-1 day(s).    Dr. Jose Persia Internal Medicine PGY-1  Pager: [phone] 08/23/2019, 7:46 AM

## 2019-08-23 NOTE — Progress Notes (Signed)
Patient ID: Dustin [name], adult   DOB: December 13, 1995, 24 y.o.   MRN: [id number]          [facility] for Infectious Disease    Date of Admission:  08/21/2019    Day 2 vancomycin        Day 2 cefepime  She tells me that she is feeling much better.  Initially she told me that she was not having any dysuria but she now says that she is having some increased frequency and urgency with urination.  Her urinalysis shows pyuria and urine culture is growing E. coli.  Discharged on 2-3 more days of oral cephalexin.  We will get her a supply of her HIV medicines and arrange follow-up in clinic this Friday at 11:15 AM.  I will sign off now.         Cliffton Asters, MD [facility] for Infectious Disease [facility] [phone] pager   [phone] cell 08/23/2019, 11:16 AM

## 2019-08-23 NOTE — Social Work (Signed)
F/u appointment made for pt at [facility] at 5/19 at 9:50am. This has been added to pt AVS.   Dustin [name], MSW, LCSW [place] Clinical Social Work

## 2019-08-23 NOTE — TOC Initial Note (Signed)
Transition of Care [facility]) - Initial/Assessment Note    Patient Details  Name: Dustin [name] MRN: [id number] Date of Birth: [birth year]  Transition of Care [facility]) CM/SW Contact:    Kingsley Plan, RN Phone Number: 08/23/2019, 1:12 PM  Clinical Narrative:                 Patient from home has mother. Follow up appointment made. Discussed with patient changing Medicaid to [place]. Patient voiced understanding. Prescriptions sent to [facility] pharmacy total cost $50.00 patient states she has $50 and can pay for medications. TOC Pharmacy aware   Expected Discharge Plan: Home/Self Care Barriers to Discharge: No Barriers Identified   Patient Goals and CMS Choice Patient states their goals for this hospitalization and ongoing recovery are:: to return to home CMS Medicare.gov Compare Post Acute Care list provided to:: Patient Choice offered to / list presented to : NA  Expected Discharge Plan and Services Expected Discharge Plan: Home/Self Care     Post Acute Care Choice: NA Living arrangements for the past 2 months: Single Family Home                 DME Arranged: N/A DME Agency: NA       HH Arranged: NA          Prior Living Arrangements/Services Living arrangements for the past 2 months: Single Family Home   Patient language and need for interpreter reviewed:: Yes        Need for Family Participation in Patient Care: Yes (Comment) Care giver support system in place?: Yes (comment)   Criminal Activity/Legal Involvement Pertinent to Current Situation/Hospitalization: No - Comment as needed  Activities of Daily Living      Permission Sought/Granted   Permission granted to share information with : No              Emotional Assessment Appearance:: Appears stated age Attitude/Demeanor/Rapport: Engaged Affect (typically observed): Accepting Orientation: : Oriented to Self, Oriented to Place, Oriented to  Time, Oriented to Situation Alcohol / Substance Use: Not  Applicable Psych Involvement: No (comment)  Admission diagnosis:  Sepsis (HCC) [A41.9] Sepsis, due to unspecified organism, unspecified whether acute organ dysfunction present [facility]) [A41.9] Patient Active Problem List   Diagnosis Date Noted  . Alkaline phosphatase elevation   . Sepsis (HCC) 08/21/2019  . Microcytic anemia 08/21/2019  . Depression 08/21/2019  . Latent syphilis 08/21/2019  . AKI (acute kidney injury) (HCC)   . HIV infection (HCC)   . [name]   . History of nephrolithiasis 12/23/2012  . Exposure to communicable disease 07/21/2011  . ATOPIC DERMATITIS 08/15/2007  . RHINITIS, ALLERGIC 06/17/2006   PCP:  Patient, No Pcp Per Pharmacy:   [facility] [address] [place] - [address] [place] [address] Phone: [phone] Fax: [phone]  [name] [place], [place] - [address] [place] [address] Phone: [phone] Fax: [phone]     Social Determinants of Health (SDOH) Interventions    Readmission Risk Interventions No flowsheet data found.

## 2019-08-23 NOTE — Discharge Summary (Signed)
Name: Dustin [name] MRN: [id number] DOB: 06/03/1995 23 y.o. PCP: Patient, No Pcp Per  Date of Admission: 08/21/2019  2:15 AM Date of Discharge: 08/23/2019 Attending Physician: Sid Falcon, MD  Discharge Diagnosis: 1.  UTI 2.  AKI 3.  HIV with progression to AIDS 4.  Acute on chronic IDA 5.  Weight loss 6.  Thrush 7.  Elevated alk phos 8.  Vitamin D deficiency  Discharge Medications: Allergies as of 08/23/2019   No Known Allergies     Medication List    STOP taking these medications   lisinopril 20 MG tablet Commonly known as: ZESTRIL   sulfamethoxazole-trimethoprim 800-160 MG tablet Commonly known as: BACTRIM DS     TAKE these medications   Biktarvy 50-200-25 MG Tabs tablet Generic drug: bictegravir-emtricitabine-tenofovir AF Take 1 tablet by mouth daily. Notes to patient: 08/24/19   cephALEXin 500 MG capsule Commonly known as: KEFLEX Take 1 capsule (500 mg total) by mouth every 8 (eight) hours. Notes to patient: 08/23/19   dapsone 100 MG tablet Take 1 tablet (100 mg total) by mouth daily. Notes to patient: 08/24/19   ferrous sulfate 325 (65 FE) MG tablet Take 1 tablet (325 mg total) by mouth daily with breakfast. Notes to patient: 08/24/19   fluconazole 100 MG tablet Commonly known as: DIFLUCAN Take 1 tablet (100 mg total) by mouth daily. Notes to patient: 08/24/19   Vitamin D (Ergocalciferol) 1.25 MG (50000 UNIT) Caps capsule Commonly known as: DRISDOL Take 1 capsule (50,000 Units total) by mouth every 7 (seven) days. Start taking on: Aug 29, 2019 Notes to patient: 08/29/19       Disposition and follow-up:   Dustin [name] was discharged from [facility] in Good condition.  At the hospital follow up visit please address:  1.  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  Labs / imaging needed at time of follow-up: CMP, CBC  3.  Pending labs/ test needing follow-up: None  Follow-up Appointments: Follow-up Information    [place]. Go on 09/06/2019.   Why: You have a follow up visit scheduled, 9:50am. Please call with any questions. Contact information: [place] [phone] [place] [facility] Course by problem list: 1.  UTI: This is a 24 year old transgender woman with a history of HIV/AIDS who presented with worsening weakness, weight loss, and a productive cough.  Noted recent admission for pneumonia treated with ceftriaxone and azithromycin as well as oral thrush and latent syphilis.  She was noted to be febrile to 102.2, tachycardic, and hypertensive on admission.  Significant for potassium 3.4, creatinine 1.57(baseline around 1), WBC 13, hemoglobin 7.5, platelets 512.  Urinalysis showed small hemoglobin, positive nitrites, large leukocytes, few bacteria,>50 WBCs.  Urine culture grew 10,000 E. Coli.  Patient was being treated with Vanco and cefepime, transitioned to oral cephalexin and discharged with oral Keflex to complete 5-day course.  2.  AKI: Creatinine elevated to 1.72 on admission, baseline around 1.  Treated with IV fluids.  Protein creatinine ratio of 0.77.  Not much improvement with IV fluid resuscitation.  Obtained abdominal ultrasound that showed no kidney abnormalities, tiny amount of perinephric fluid around the left kidney.  Concerning for HIV nephropathy.  Advised to follow-up with nephrology.  3.  HIV with progression to AIDS: Patient not on any antiretrovirals on admission, CD4 was<35.  ID was consulted given the sepsis criteria on admission.  Patient was started on Biktarvy, fluconazole, dapsone, and TMP/SMX.  Obtained AFP blood culture and GC and chlamydia.  GC and Chlamydia was negative.  Histoplasma antigen negative.  Unclear if AFP was done.   ID assisted with providing HIV medications.  Arranged outpatient follow-up . 4.  Acute on chronic IDA: Patient noted to have a history of iron deficiency anemia, hemoglobin 7.5 on admission.  Baseline around 7-8.  Received 1 unit of packed red blood cells for hemoglobin of 6.8.  Unable to give IV iron until HIV and UTI have been treated, continue to oral supplements.   5.  Weight loss: Weight varied between 103-106 in 2019, now down to 71-76 kg.  Patient appears cachectic on exam.  Likely secondary to HIV related cachexia. 6.  Thrush: Patient treated with oral nystatin 7.  Elevated alk phos: Initially up to 150, came down with fluids.  GGT elevated as well to 102.  Abdominal ultrasound negative for stones.  Resolved on day of discharge.  8.  Vitamin D deficiency: Vitamin D level of 7, started on vitamin D 50,000 units weekly.  Discharge Vitals:   BP (!) 138/110 (BP Location: Right Arm)   Pulse 78   Temp 97.9 F (36.6 C)   Resp 18   Ht 6' 5"  (1.956 m)   Wt 75 kg   SpO2 100%   BMI 19.61 kg/m   Pertinent Labs, Studies, and Procedures:  CBC Latest Ref Rng & Units 08/23/2019 08/22/2019 08/22/2019  WBC 4.0 - 10.5 K/uL 6.7 - 8.9  Hemoglobin 13.0 - 17.0 g/dL 7.9(L) 8.2(L) 6.8(LL)  Hematocrit 39.0 - 52.0 % 24.9(L) 25.6(L) 21.7(L)  Platelets 150 - 400 K/uL 430(H) - 472(H)   BMP Latest Ref Rng & Units 08/23/2019 08/22/2019 08/21/2019  Glucose 70 - 99 mg/dL 138(H) 118(H) 100(H)  BUN 6 - 20 mg/dL 15 19 19   Creatinine 0.61 - 1.24 mg/dL 1.36(H) 1.50(H) 1.57(H)  Sodium 135 - 145 mmol/L 137 134(L) 135  Potassium 3.5 - 5.1 mmol/L 3.6 3.4(L) 3.4(L)  Chloride 98 - 111 mmol/L 110 105 104  CO2 22 - 32 mmol/L 20(L) 20(L) 22  Calcium 8.9 - 10.3 mg/dL 8.5(L) 8.7(L) 8.8(L)   08/21/19 Right forearm x-ray: IMPRESSION: Negative.  08/21/2019 chest x-ray:  IMPRESSION: No evidence of active disease.  08/22/2019 right upper quadrant ultrasound: IMPRESSION: 1.  Tiny amount of left perinephric fluid.  Small amount of  ascites.  2. Exam otherwise unremarkable. No gallstones or biliary distention. No focal hepatic abnormality.  Discharge Instructions: Discharge Instructions    Call MD for:  difficulty breathing, headache or visual disturbances   Complete by: As directed    Call MD for:  extreme fatigue   Complete by: As directed    Call MD for:  hives   Complete by: As directed    Call MD for:  persistant dizziness or light-headedness   Complete by: As directed    Call MD for:  persistant nausea and vomiting   Complete by: As directed    Call MD for:  severe uncontrolled pain   Complete by: As directed    Call MD for:  temperature >100.4   Complete by: As directed    Diet - low sodium heart healthy   Complete by: As directed    Discharge instructions   Complete by: As directed    Ms.  Peyton,  You were admitted to the hospital due to a Urinary Tract Infection (UTI) that caused a significant fever and weakness. Please continue Keflex (Cephalexin) for an additional 2 days. While here, we also did a blood transfusion and started back on iron supplementation. In addition, please take the Vitamin D supplements once per week.   It will also be very important to take your Biktarvy and Dapsone daily to prevent future infections.   It was a pleasure taking care of you!   Increase activity slowly   Complete by: As directed      Signed: Asencion Noble, M.D. PGY2 Pager [phone] 08/26/2019 12:13 PM

## 2019-08-25 ENCOUNTER — Ambulatory Visit: Payer: Medicaid - Out of State | Admitting: Infectious Diseases

## 2019-08-26 LAB — CULTURE, BLOOD (ROUTINE X 2)
Culture: NO GROWTH
Culture: NO GROWTH

## 2019-08-30 ENCOUNTER — Telehealth: Payer: Self-pay | Admitting: Pharmacy Technician

## 2019-08-30 NOTE — Telephone Encounter (Signed)
[facility] Patient Advocate Encounter   Patient has been approved for [employer] Advancing Access Patient Assistance Program for [place]  for up to 25-months beginning 08/23/2019. This assistance will make the patient's copay $0.  The patient picked up at transitions of care during hospitalization.   The billing information is:  Member ID: [id number] RxBin: [id number] PCN: [id number] Group: [id number]  Patient knows to call the office with questions or concerns.  Beulah Gandy, CPhT Specialty Pharmacy Patient [facility] for Infectious Disease Phone: [phone] Fax: [phone] 08/30/2019 4:18 PM

## 2019-09-06 ENCOUNTER — Ambulatory Visit: Payer: Medicaid - Out of State
